# Patient Record
Sex: Male | Born: 1960 | Race: Black or African American | Hispanic: No | Marital: Married | Smoking: Never smoker
Health system: Southern US, Community
[De-identification: ages and names within clinical notes are randomized; demographics above are authoritative.]

## PROBLEM LIST (undated history)

## (undated) DIAGNOSIS — K4090 Unilateral inguinal hernia, without obstruction or gangrene, not specified as recurrent: Secondary | ICD-10-CM

## (undated) DIAGNOSIS — E785 Hyperlipidemia, unspecified: Secondary | ICD-10-CM

## (undated) DIAGNOSIS — E119 Type 2 diabetes mellitus without complications: Secondary | ICD-10-CM

## (undated) HISTORY — PX: WISDOM TOOTH EXTRACTION: SHX21

---

## 2017-05-18 ENCOUNTER — Ambulatory Visit: Payer: Self-pay | Admitting: General Surgery

## 2017-06-22 ENCOUNTER — Encounter (HOSPITAL_BASED_OUTPATIENT_CLINIC_OR_DEPARTMENT_OTHER)
Admission: RE | Admit: 2017-06-22 | Discharge: 2017-06-22 | Disposition: A | Discharge status: Home / Self Care | Payer: PRIVATE HEALTH INSURANCE | Source: Ambulatory Visit | Attending: General Surgery | Admitting: General Surgery

## 2017-06-22 ENCOUNTER — Other Ambulatory Visit: Payer: Self-pay

## 2017-06-22 ENCOUNTER — Encounter (HOSPITAL_BASED_OUTPATIENT_CLINIC_OR_DEPARTMENT_OTHER): Payer: Self-pay | Admitting: *Deleted

## 2017-06-22 DIAGNOSIS — E785 Hyperlipidemia, unspecified: Secondary | ICD-10-CM | POA: Diagnosis not present

## 2017-06-22 DIAGNOSIS — Z7982 Long term (current) use of aspirin: Secondary | ICD-10-CM | POA: Diagnosis not present

## 2017-06-22 DIAGNOSIS — E119 Type 2 diabetes mellitus without complications: Secondary | ICD-10-CM | POA: Diagnosis not present

## 2017-06-22 DIAGNOSIS — Z79899 Other long term (current) drug therapy: Secondary | ICD-10-CM | POA: Diagnosis not present

## 2017-06-22 DIAGNOSIS — Z7984 Long term (current) use of oral hypoglycemic drugs: Secondary | ICD-10-CM | POA: Diagnosis not present

## 2017-06-22 DIAGNOSIS — K4091 Unilateral inguinal hernia, without obstruction or gangrene, recurrent: Secondary | ICD-10-CM | POA: Diagnosis present

## 2017-06-22 DIAGNOSIS — K4090 Unilateral inguinal hernia, without obstruction or gangrene, not specified as recurrent: Secondary | ICD-10-CM | POA: Diagnosis not present

## 2017-06-22 LAB — BASIC METABOLIC PANEL
Anion gap: 8 (ref 5–15)
BUN: 30 mg/dL — ABNORMAL HIGH (ref 6–20)
CO2: 25 mmol/L (ref 22–32)
Calcium: 10.3 mg/dL (ref 8.9–10.3)
Chloride: 104 mmol/L (ref 101–111)
Creatinine, Ser: 1.43 mg/dL — ABNORMAL HIGH (ref 0.61–1.24)
GFR calc Af Amer: >60 mL/min (ref >60)
GFR calc non Af Amer: 55 mL/min — ABNORMAL LOW (ref >60)
Glucose, Bld: 217 mg/dL — ABNORMAL HIGH (ref 65–99)
Potassium: 4.6 mmol/L (ref 3.5–5.1)
Sodium: 137 mmol/L (ref 135–145)

## 2017-06-22 LAB — CBC WITH DIFFERENTIAL/PLATELET
Basophils Absolute: 0 10*3/uL (ref 0.0–0.1)
Basophils Relative: 1 %
Eosinophils Absolute: 0.1 10*3/uL (ref 0.0–0.7)
Eosinophils Relative: 3 %
HCT: 40.2 % (ref 39.0–52.0)
Hemoglobin: 13.4 g/dL (ref 13.0–17.0)
Lymphocytes Relative: 29 %
Lymphs Abs: 1.1 10*3/uL (ref 0.7–4.0)
MCH: 30.9 pg (ref 26.0–34.0)
MCHC: 33.3 g/dL (ref 30.0–36.0)
MCV: 92.6 fL (ref 78.0–100.0)
Monocytes Absolute: 0.4 10*3/uL (ref 0.1–1.0)
Monocytes Relative: 11 %
Neutro Abs: 2.2 10*3/uL (ref 1.7–7.7)
Neutrophils Relative %: 56 %
Platelets: 240 10*3/uL (ref 150–400)
RBC: 4.34 MIL/uL (ref 4.22–5.81)
RDW: 12.7 % (ref 11.5–15.5)
WBC: 3.8 10*3/uL — ABNORMAL LOW (ref 4.0–10.5)

## 2017-06-26 ENCOUNTER — Ambulatory Visit (HOSPITAL_BASED_OUTPATIENT_CLINIC_OR_DEPARTMENT_OTHER): Payer: PRIVATE HEALTH INSURANCE | Admitting: Anesthesiology

## 2017-06-26 ENCOUNTER — Encounter (HOSPITAL_BASED_OUTPATIENT_CLINIC_OR_DEPARTMENT_OTHER)
Admission: RE | Disposition: A | Discharge status: Home / Self Care | Payer: Self-pay | Source: Ambulatory Visit | Attending: General Surgery

## 2017-06-26 ENCOUNTER — Ambulatory Visit (HOSPITAL_BASED_OUTPATIENT_CLINIC_OR_DEPARTMENT_OTHER)
Admission: RE | Admit: 2017-06-26 | Discharge: 2017-06-26 | Disposition: A | Discharge status: Home / Self Care | Payer: PRIVATE HEALTH INSURANCE | Source: Ambulatory Visit | Attending: General Surgery | Admitting: General Surgery

## 2017-06-26 ENCOUNTER — Encounter (HOSPITAL_BASED_OUTPATIENT_CLINIC_OR_DEPARTMENT_OTHER): Payer: Self-pay

## 2017-06-26 DIAGNOSIS — Z7982 Long term (current) use of aspirin: Secondary | ICD-10-CM | POA: Insufficient documentation

## 2017-06-26 DIAGNOSIS — E119 Type 2 diabetes mellitus without complications: Secondary | ICD-10-CM | POA: Insufficient documentation

## 2017-06-26 DIAGNOSIS — Z79899 Other long term (current) drug therapy: Secondary | ICD-10-CM | POA: Insufficient documentation

## 2017-06-26 DIAGNOSIS — Z7984 Long term (current) use of oral hypoglycemic drugs: Secondary | ICD-10-CM | POA: Insufficient documentation

## 2017-06-26 DIAGNOSIS — K4090 Unilateral inguinal hernia, without obstruction or gangrene, not specified as recurrent: Secondary | ICD-10-CM | POA: Insufficient documentation

## 2017-06-26 DIAGNOSIS — Z01818 Encounter for other preprocedural examination: Secondary | ICD-10-CM

## 2017-06-26 DIAGNOSIS — E785 Hyperlipidemia, unspecified: Secondary | ICD-10-CM | POA: Insufficient documentation

## 2017-06-26 HISTORY — PX: INSERTION OF MESH: SHX5868

## 2017-06-26 HISTORY — DX: Hyperlipidemia, unspecified: E78.5

## 2017-06-26 HISTORY — PX: INGUINAL HERNIA REPAIR: SHX194

## 2017-06-26 HISTORY — DX: Type 2 diabetes mellitus without complications: E11.9

## 2017-06-26 HISTORY — DX: Unilateral inguinal hernia, without obstruction or gangrene, not specified as recurrent: K40.90

## 2017-06-26 LAB — GLUCOSE, CAPILLARY
Glucose-Capillary: 192 mg/dL — ABNORMAL HIGH (ref 65–99)
Glucose-Capillary: 179 mg/dL — ABNORMAL HIGH (ref 65–99)

## 2017-06-26 SURGERY — REPAIR, HERNIA, INGUINAL, ADULT
Anesthesia: Regional | Site: Groin | Laterality: Left

## 2017-06-26 MED ORDER — LIDOCAINE HCL (CARDIAC) 20 MG/ML IV SOLN
INTRAVENOUS | Status: DC | PRN
  Administered 2017-06-26: 30 mg via INTRAVENOUS

## 2017-06-26 MED ORDER — OXYCODONE HCL 5 MG PO TABS
5.0000 mg | ORAL_TABLET | Freq: Once | ORAL | Status: AC | PRN
  Administered 2017-06-26: 5 mg via ORAL

## 2017-06-26 MED ORDER — GABAPENTIN 300 MG PO CAPS
ORAL_CAPSULE | ORAL | Status: AC
  Filled 2017-06-26: qty 1

## 2017-06-26 MED ORDER — SUCCINYLCHOLINE CHLORIDE 200 MG/10ML IV SOSY
PREFILLED_SYRINGE | INTRAVENOUS | Status: AC
  Filled 2017-06-26: qty 10

## 2017-06-26 MED ORDER — ROPIVACAINE HCL 7.5 MG/ML IJ SOLN
INTRAMUSCULAR | Status: DC | PRN
  Administered 2017-06-26: 20 mL via PERINEURAL

## 2017-06-26 MED ORDER — HYDROMORPHONE HCL 1 MG/ML IJ SOLN: 0.2500 mg | INTRAMUSCULAR | Status: DC | PRN

## 2017-06-26 MED ORDER — SCOPOLAMINE 1 MG/3DAYS TD PT72: 1.0000 | MEDICATED_PATCH | Freq: Once | TRANSDERMAL | Status: DC | PRN

## 2017-06-26 MED ORDER — DEXAMETHASONE SODIUM PHOSPHATE 10 MG/ML IJ SOLN
INTRAMUSCULAR | Status: AC
  Filled 2017-06-26: qty 1

## 2017-06-26 MED ORDER — PROPOFOL 10 MG/ML IV BOLUS
INTRAVENOUS | Status: DC | PRN
  Administered 2017-06-26: 150 mg via INTRAVENOUS

## 2017-06-26 MED ORDER — PROPOFOL 10 MG/ML IV BOLUS
INTRAVENOUS | Status: AC
  Filled 2017-06-26: qty 20

## 2017-06-26 MED ORDER — FENTANYL CITRATE (PF) 100 MCG/2ML IJ SOLN
INTRAMUSCULAR | Status: DC | PRN
  Administered 2017-06-26: 100 ug via INTRAVENOUS
  Administered 2017-06-26: 25 ug via INTRAVENOUS

## 2017-06-26 MED ORDER — SODIUM CHLORIDE 0.9 % IV SOLN
INTRAVENOUS | Status: DC | PRN
  Administered 2017-06-26: 500 mL

## 2017-06-26 MED ORDER — CEFAZOLIN SODIUM 1 G IJ SOLR
INTRAMUSCULAR | Status: AC
  Filled 2017-06-26: qty 20

## 2017-06-26 MED ORDER — PHENYLEPHRINE 40 MCG/ML (10ML) SYRINGE FOR IV PUSH (FOR BLOOD PRESSURE SUPPORT)
PREFILLED_SYRINGE | INTRAVENOUS | Status: AC
  Filled 2017-06-26: qty 10

## 2017-06-26 MED ORDER — MIDAZOLAM HCL 2 MG/2ML IJ SOLN
INTRAMUSCULAR | Status: AC
  Filled 2017-06-26: qty 2

## 2017-06-26 MED ORDER — GABAPENTIN 300 MG PO CAPS
300.0000 mg | ORAL_CAPSULE | ORAL | Status: AC
  Administered 2017-06-26: 300 mg via ORAL

## 2017-06-26 MED ORDER — ONDANSETRON HCL 4 MG/2ML IJ SOLN
INTRAMUSCULAR | Status: DC | PRN
  Administered 2017-06-26: 4 mg via INTRAVENOUS

## 2017-06-26 MED ORDER — MIDAZOLAM HCL 5 MG/5ML IJ SOLN
INTRAMUSCULAR | Status: DC | PRN
  Administered 2017-06-26: 2 mg via INTRAVENOUS

## 2017-06-26 MED ORDER — BUPIVACAINE-EPINEPHRINE (PF) 0.25% -1:200000 IJ SOLN
INTRAMUSCULAR | Status: AC
  Filled 2017-06-26: qty 90

## 2017-06-26 MED ORDER — BUPIVACAINE HCL (PF) 0.5 % IJ SOLN
INTRAMUSCULAR | Status: DC | PRN
  Administered 2017-06-26: 9 mL

## 2017-06-26 MED ORDER — ACETAMINOPHEN 500 MG PO TABS
ORAL_TABLET | ORAL | Status: AC
  Filled 2017-06-26: qty 2

## 2017-06-26 MED ORDER — OXYCODONE HCL 5 MG/5ML PO SOLN: 5.0000 mg | Freq: Once | ORAL | Status: AC | PRN

## 2017-06-26 MED ORDER — BUPIVACAINE HCL (PF) 0.5 % IJ SOLN
INTRAMUSCULAR | Status: AC
  Filled 2017-06-26: qty 90

## 2017-06-26 MED ORDER — FENTANYL CITRATE (PF) 100 MCG/2ML IJ SOLN
INTRAMUSCULAR | Status: AC
  Filled 2017-06-26: qty 2

## 2017-06-26 MED ORDER — CEFAZOLIN SODIUM-DEXTROSE 2-4 GM/100ML-% IV SOLN
2.0000 g | INTRAVENOUS | Status: AC
  Administered 2017-06-26: 2 g via INTRAVENOUS

## 2017-06-26 MED ORDER — MIDAZOLAM HCL 2 MG/2ML IJ SOLN
1.0000 mg | INTRAMUSCULAR | Status: DC | PRN
  Administered 2017-06-26: 2 mg via INTRAVENOUS

## 2017-06-26 MED ORDER — SODIUM BICARBONATE 4 % IV SOLN
INTRAVENOUS | Status: AC
  Filled 2017-06-26: qty 5

## 2017-06-26 MED ORDER — LIDOCAINE 2% (20 MG/ML) 5 ML SYRINGE
INTRAMUSCULAR | Status: AC
  Filled 2017-06-26: qty 5

## 2017-06-26 MED ORDER — LIDOCAINE HCL (PF) 1 % IJ SOLN
INTRAMUSCULAR | Status: AC
  Filled 2017-06-26: qty 30

## 2017-06-26 MED ORDER — ONDANSETRON HCL 4 MG/2ML IJ SOLN
INTRAMUSCULAR | Status: AC
  Filled 2017-06-26: qty 2

## 2017-06-26 MED ORDER — DEXAMETHASONE SODIUM PHOSPHATE 4 MG/ML IJ SOLN
INTRAMUSCULAR | Status: DC | PRN
  Administered 2017-06-26: 10 mg via INTRAVENOUS

## 2017-06-26 MED ORDER — LACTATED RINGERS IV SOLN
INTRAVENOUS | Status: DC
  Administered 2017-06-26 (×2): via INTRAVENOUS

## 2017-06-26 MED ORDER — OXYCODONE HCL 5 MG PO TABS: 5.0000 mg | ORAL_TABLET | Freq: Four times a day (QID) | ORAL | 0 refills | Status: DC | PRN

## 2017-06-26 MED ORDER — PROMETHAZINE HCL 25 MG/ML IJ SOLN: 6.2500 mg | INTRAMUSCULAR | Status: DC | PRN

## 2017-06-26 MED ORDER — ACETAMINOPHEN 500 MG PO TABS
1000.0000 mg | ORAL_TABLET | ORAL | Status: AC
  Administered 2017-06-26: 1000 mg via ORAL

## 2017-06-26 MED ORDER — CHLORHEXIDINE GLUCONATE CLOTH 2 % EX PADS: 6.0000 | MEDICATED_PAD | Freq: Once | CUTANEOUS | Status: DC

## 2017-06-26 MED ORDER — PHENYLEPHRINE HCL 10 MG/ML IJ SOLN
INTRAMUSCULAR | Status: DC | PRN
  Administered 2017-06-26 (×5): 80 ug via INTRAVENOUS

## 2017-06-26 MED ORDER — OXYCODONE HCL 5 MG PO TABS
ORAL_TABLET | ORAL | Status: AC
  Filled 2017-06-26: qty 1

## 2017-06-26 MED ORDER — BUPIVACAINE-EPINEPHRINE (PF) 0.5% -1:200000 IJ SOLN
INTRAMUSCULAR | Status: AC
  Filled 2017-06-26: qty 90

## 2017-06-26 MED ORDER — SODIUM CHLORIDE 0.9 % IJ SOLN
INTRAMUSCULAR | Status: AC
  Filled 2017-06-26: qty 10

## 2017-06-26 MED ORDER — FENTANYL CITRATE (PF) 100 MCG/2ML IJ SOLN: 50.0000 ug | INTRAMUSCULAR | Status: DC | PRN

## 2017-06-26 SURGICAL SUPPLY — 48 items
BAG DECANTER FOR FLEXI CONT (MISCELLANEOUS) ×3 IMPLANT
BLADE CLIPPER SURG (BLADE) ×3 IMPLANT
BLADE SURG 10 STRL SS (BLADE) ×3 IMPLANT
BLADE SURG 15 STRL LF DISP TIS (BLADE) ×2 IMPLANT
BLADE SURG 15 STRL SS (BLADE) ×4
CHLORAPREP W/TINT 26ML (MISCELLANEOUS) ×3 IMPLANT
CLEANER CAUTERY TIP 5X5 PAD (MISCELLANEOUS) ×1 IMPLANT
CLOSURE WOUND 1/2 X4 (GAUZE/BANDAGES/DRESSINGS) ×1
COVER BACK TABLE 60X90IN (DRAPES) ×3 IMPLANT
COVER MAYO STAND STRL (DRAPES) ×3 IMPLANT
DERMABOND ADVANCED (GAUZE/BANDAGES/DRESSINGS) ×2
DERMABOND ADVANCED .7 DNX12 (GAUZE/BANDAGES/DRESSINGS) ×1 IMPLANT
DRAIN PENROSE 1/2X12 LTX STRL (WOUND CARE) ×3 IMPLANT
DRAPE LAPAROTOMY TRNSV 102X78 (DRAPE) ×3 IMPLANT
DRAPE UTILITY XL STRL (DRAPES) ×3 IMPLANT
DRSG TEGADERM 4X4.75 (GAUZE/BANDAGES/DRESSINGS) ×3 IMPLANT
ELECT REM PT RETURN 9FT ADLT (ELECTROSURGICAL) ×3
ELECTRODE REM PT RTRN 9FT ADLT (ELECTROSURGICAL) ×1 IMPLANT
GAUZE SPONGE 4X4 12PLY STRL LF (GAUZE/BANDAGES/DRESSINGS) ×3 IMPLANT
GLOVE BIO SURGEON STRL SZ 6.5 (GLOVE) ×2 IMPLANT
GLOVE BIO SURGEONS STRL SZ 6.5 (GLOVE) ×1
GLOVE BIOGEL PI IND STRL 8 (GLOVE) ×1 IMPLANT
GLOVE BIOGEL PI INDICATOR 8 (GLOVE) ×2
GLOVE ECLIPSE 7.5 STRL STRAW (GLOVE) ×3 IMPLANT
GOWN STRL REUS W/ TWL LRG LVL3 (GOWN DISPOSABLE) ×1 IMPLANT
GOWN STRL REUS W/ TWL XL LVL3 (GOWN DISPOSABLE) ×1 IMPLANT
GOWN STRL REUS W/TWL LRG LVL3 (GOWN DISPOSABLE) ×2
GOWN STRL REUS W/TWL XL LVL3 (GOWN DISPOSABLE) ×2
MESH HERNIA 3X6 (Mesh General) ×3 IMPLANT
NEEDLE HYPO 25X1 1.5 SAFETY (NEEDLE) ×3 IMPLANT
PACK BASIN DAY SURGERY FS (CUSTOM PROCEDURE TRAY) ×3 IMPLANT
PAD CLEANER CAUTERY TIP 5X5 (MISCELLANEOUS) ×2
PENCIL BUTTON HOLSTER BLD 10FT (ELECTRODE) ×3 IMPLANT
SLEEVE SCD COMPRESS KNEE MED (MISCELLANEOUS) IMPLANT
SPONGE INTESTINAL PEANUT (DISPOSABLE) ×3 IMPLANT
SPONGE LAP 4X18 X RAY DECT (DISPOSABLE) ×3 IMPLANT
STRIP CLOSURE SKIN 1/2X4 (GAUZE/BANDAGES/DRESSINGS) ×2 IMPLANT
SUT ETHIBOND 0 MO6 C/R (SUTURE) ×3 IMPLANT
SUT MON AB 4-0 PC3 18 (SUTURE) ×3 IMPLANT
SUT PROLENE 0 CT 2 (SUTURE) ×6 IMPLANT
SUT VIC AB 3-0 SH 27 (SUTURE) ×4
SUT VIC AB 3-0 SH 27X BRD (SUTURE) ×2 IMPLANT
SUT VICRYL 4-0 PS2 18IN ABS (SUTURE) IMPLANT
SUT VICRYL AB 3 0 TIES (SUTURE) IMPLANT
SYR BULB 3OZ (MISCELLANEOUS) ×3 IMPLANT
SYR CONTROL 10ML LL (SYRINGE) ×3 IMPLANT
TOWEL OR 17X24 6PK STRL BLUE (TOWEL DISPOSABLE) ×3 IMPLANT
TOWEL OR NON WOVEN STRL DISP B (DISPOSABLE) ×3 IMPLANT

## 2017-06-26 NOTE — Progress Notes (Signed)
Assisted Dr. Ellender with left, ultrasound guided, transabdominal plane block. Side rails up, monitors on throughout procedure. See vital signs in flow sheet. Tolerated Procedure well. 

## 2017-06-26 NOTE — Anesthesia Procedure Notes (Signed)
Anesthesia Regional Block: TAP block   Pre-Anesthetic Checklist: ,, timeout performed, Correct Patient, Correct Site, Correct Laterality, Correct Procedure,, site marked, risks and benefits discussed, Surgical consent,  Pre-op evaluation,  At surgeon's request and post-op pain management  Laterality: Left  Prep: chloraprep       Needles:  Injection technique: Single-shot  Needle Type: Echogenic Stimulator Needle     Needle Length: 9cm  Needle Gauge: 21     Additional Needles:   Procedures: ultrasound guided,,,,,,,,  Narrative:  Start time: 06/26/2017 7:10 AM End time: 06/26/2017 7:20 AM Injection made incrementally with aspirations every 5 mL.  Performed by: Personally  Anesthesiologist: Karna ChristmasELLENDER, RYAN P  Additional Notes: Functioning IV was confirmed and monitors were applied.  A 90mm 21ga Arrow echogenic stimulator needle was used. Sterile prep, hand hygiene and sterile gloves were used.  Negative aspiration and negative test dose prior to incremental administration of local anesthetic. The patient tolerated the procedure well.

## 2017-06-26 NOTE — Anesthesia Procedure Notes (Signed)
Procedure Name: LMA Insertion Date/Time: 06/26/2017 7:41 AM Performed by: Genevieve NorlanderLINKA, Veva Grimley L Pre-anesthesia Checklist: Patient identified, Emergency Drugs available, Suction available, Patient being monitored and Timeout performed Patient Re-evaluated:Patient Re-evaluated prior to induction Oxygen Delivery Method: Circle system utilized Preoxygenation: Pre-oxygenation with 100% oxygen Induction Type: IV induction Ventilation: Mask ventilation without difficulty LMA: LMA inserted LMA Size: 5.0 Number of attempts: 1 Airway Equipment and Method: Bite block Placement Confirmation: positive ETCO2 Tube secured with: Tape Dental Injury: Teeth and Oropharynx as per pre-operative assessment

## 2017-06-26 NOTE — Discharge Instructions (Addendum)
Open Hernia Repair, Adult, Care After This sheet gives you information about how to care for yourself after your procedure. Your health care provider may also give you more specific instructions. If you have problems or questions, contact your health care provider. What can I expect after the procedure? After the procedure, it is common to have:  Mild discomfort.  Slight bruising.  Minor swelling.  Pain in the abdomen.  Follow these instructions at home: Incision care   Follow instructions from your health care provider about how to take care of your incision area. Make sure you: ? Wash your hands with soap and water before you change your bandage (dressing). If soap and water are not available, use hand sanitizer. ? Change your dressing as told by your health care provider. ? Leave stitches (sutures), skin glue, or adhesive strips in place. These skin closures may need to stay in place for 2 weeks or longer. If adhesive strip edges start to loosen and curl up, you may trim the loose edges. Do not remove adhesive strips completely unless your health care provider tells you to do that.  Check your incision area every day for signs of infection. Check for: ? More redness, swelling, or pain. ? More fluid or blood. ? Warmth. ? Pus or a bad smell. Activity  Do not drive or use heavy machinery while taking prescription pain medicine. Do not drive until your health care provider approves.  Until your health care provider approves: ? Do not lift anything that is heavier than 10 lb (4.5 kg). ? Do not play contact sports.  Return to your normal activities as told by your health care provider. Ask your health care provider what activities are safe. General instructions  To prevent or treat constipation while you are taking prescription pain medicine, your health care provider may recommend that you: ? Drink enough fluid to keep your urine clear or pale yellow. ? Take over-the-counter or  prescription medicines. ? Eat foods that are high in fiber, such as fresh fruits and vegetables, whole grains, and beans. ? Limit foods that are high in fat and processed sugars, such as fried and sweet foods.  Take over-the-counter and prescription medicines only as told by your health care provider.  Do not take tub baths or go swimming until your health care provider approves.  Keep all follow-up visits as told by your health care provider. This is important. Contact a health care provider if:  You develop a rash.  You have more redness, swelling, or pain around your incision.  You have more fluid or blood coming from your incision.  Your incision feels warm to the touch.  You have pus or a bad smell coming from your incision.  You have a fever or chills.  You have blood in your stool (feces).  You have not had a bowel movement in 2-3 days.  Your pain is not controlled with medicine. Get help right away if:  You have chest pain or shortness of breath.  You feel light-headed or feel faint.  You have severe pain.  You vomit and your pain is worse. This information is not intended to replace advice given to you by your health care provider. Make sure you discuss any questions you have with your health care provider.  Marta Lamas. Gae Bon, MD, FACS [phone] Surgery    Post Anesthesia Home Care Instructions  Activity: Get plenty of rest for the remainder of the day. A responsible  individual must stay with you for 24 hours following the procedure.  For the next 24 hours, DO NOT: -Drive a car -Advertising copywriterperate machinery -Drink alcoholic beverages -Take any medication unless instructed by your physician -Make any legal decisions or sign important papers.  Meals: Start with liquid foods such as gelatin or soup. Progress to regular foods as tolerated. Avoid greasy, spicy, heavy foods. If nausea and/or vomiting occur, drink  only clear liquids until the nausea and/or vomiting subsides. Call your physician if vomiting continues.  Special Instructions/Symptoms: Your throat may feel dry or sore from the anesthesia or the breathing tube placed in your throat during surgery. If this causes discomfort, gargle with warm salt water. The discomfort should disappear within 24 hours.  If you had a scopolamine patch placed behind your ear for the management of post- operative nausea and/or vomiting:  1. The medication in the patch is effective for 72 hours, after which it should be removed.  Wrap patch in a tissue and discard in the trash. Wash hands thoroughly with soap and water. 2. You may remove the patch earlier than 72 hours if you experience unpleasant side effects which may include dry mouth, dizziness or visual disturbances. 3. Avoid touching the patch. Wash your hands with soap and water after contact with the patch.

## 2017-06-26 NOTE — Anesthesia Preprocedure Evaluation (Addendum)
Anesthesia Evaluation  Patient identified by MRN, date of birth, ID band Patient awake    Reviewed: Allergy & Precautions, NPO status , Patient's Chart, lab work & pertinent test results  Airway Mallampati: III  TM Distance: >3 FB Neck ROM: Full    Dental no notable dental hx.    Pulmonary neg pulmonary ROS,    Pulmonary exam normal breath sounds clear to auscultation       Cardiovascular negative cardio ROS Normal cardiovascular exam Rhythm:Regular Rate:Normal  ECG: NSR, rate 84   Neuro/Psych negative neurological ROS  negative psych ROS   GI/Hepatic negative GI ROS, Neg liver ROS,   Endo/Other  diabetes, Oral Hypoglycemic Agents  Renal/GU negative Renal ROS     Musculoskeletal negative musculoskeletal ROS (+)   Abdominal   Peds  Hematology negative hematology ROS (+)   Anesthesia Other Findings Hyperlipidemia  Reproductive/Obstetrics                            Anesthesia Physical Anesthesia Plan  ASA: III  Anesthesia Plan: General and Regional   Post-op Pain Management: GA combined w/ Regional for post-op pain   Induction: Intravenous  PONV Risk Score and Plan: 2 and Ondansetron, Dexamethasone and Midazolam  Airway Management Planned: LMA  Additional Equipment:   Intra-op Plan:   Post-operative Plan: Extubation in OR  Informed Consent: I have reviewed the patients History and Physical, chart, labs and discussed the procedure including the risks, benefits and alternatives for the proposed anesthesia with the patient or authorized representative who has indicated his/her understanding and acceptance.   Dental advisory given  Plan Discussed with: CRNA  Anesthesia Plan Comments:         Anesthesia Quick Evaluation

## 2017-06-26 NOTE — Op Note (Signed)
OPERATIVE REPORT  DATE OF OPERATION: 06/26/2017  PATIENT:  Coletta MemosKyle Klingberg  56 y.o. male  PRE-OPERATIVE DIAGNOSIS:  Symptomatic left inguinal hernia  POST-OPERATIVE DIAGNOSIS:  Symptomatic left inguinal hernia, indirect  INDICATION(S) FOR OPERATION:  Symptomatic left inguinal hernia  FINDINGS:  Indirect hernia with scarring and adhesions  PROCEDURE:  Procedure(s): OPEN LEFT INGUINAL HERNIA REPAIR WITH MESH INSERTION OF MESH  SURGEON:  Surgeon(s): Jimmye NormanWyatt, Edwin Baines, MD  ASSISTANT: None  ANESTHESIA:   general and LMA  COMPLICATIONS:  None  EBL: <10 ml  BLOOD ADMINISTERED: none  DRAINS: none   SPECIMEN:  No Specimen  COUNTS CORRECT:  YES  PROCEDURE DETAILS: The patient was taken to the operating room and placed on the table in supine position. After an adequate general laryngeal airway anesthetic was administered, he was prepped and draped in usual sterile manner exposing his left inguinal area.  He had received a tap block prior surgery. A proper timeout was performed identifying the patient and procedure to be performed. We marked the area of incision and a 6 cm incision was made curvilinear in the left inguinal area at the level of superficial inguinal ring. We dissected down to the external oblique fascia using electrocautery attaining adequate hemostasis as we went ahead. We opened the external oblique fascia through the superficial ring and bluntly dissected out the spermatic cord which contained what appeared to be a hernia sac. Once the spermatic cord was exposed we used a Penrose drain to place it up on work bench using a Penrose drain then bluntly dissected out the hernia sac in the anterior medial aspect of the cord. We dissected the sac down to the base and subsequently came across the base after opening the sac and determining the were no intra-abdominal contents contained within the sac. We twisted the second and ligated with suture ligatures of 0 Ethibond suture. 2-0 silk  sutures were used.  Once this was done there was some oozing in the wound from the cremasteric muscles which was controlled with electrocautery. We then placed an oval piece of polypropylene mesh in the inguinal floor attaching it to the reflected portion of the inguinal ligament and the conjoined tendon using running 0 Prolene suture. Once this was done we irrigated the floor with antibiotic solution. We closed the external oblique fascia on top the spermatic cord making sure not to entrap any nerves. This was done with 3-0 Vicryl. We then reapproximated Scarpa's fascia using interrupted 3-0 Vicryl sutures. We injected 0.5% Marcaine without epinephrine into the wound. We then closed the skin using a running subcuticular stitch of 4-0 Monocryl. Dermabond Steri-Strips and Tegaderms views complete the dressing.  All needle counts, sponge counts, and instrument counts were correct.  PATIENT DISPOSITION:  PACU - hemodynamically stable.   Bresha Hosack 9/11/20188:56 AM

## 2017-06-26 NOTE — H&P (Signed)
Coletta MemosKyle Klink is an 56 y.o. male.   Chief Complaint: Left inguinal hernia  HPI: Has noted this for about one year.  More symptomatic, especially with prolonged standing.  Easily reducible.  Past Medical History:  Diagnosis Date  . Diabetes mellitus without complication (HCC)   . Hyperlipidemia    on crestor  . Inguinal hernia    left    Past Surgical History:  Procedure Laterality Date  . WISDOM TOOTH EXTRACTION      History reviewed. No pertinent family history. Social History:  reports that he has never smoked. He has never used smokeless tobacco. He reports that he does not drink alcohol or use drugs.  Allergies: No Known Allergies  Medications Prior to Admission  Medication Sig Dispense Refill  . aspirin EC 81 MG tablet Take 81 mg by mouth daily.    . rosuvastatin (CRESTOR) 10 MG tablet Take 10 mg by mouth daily.    . Saxagliptin-Metformin 2.02-999 MG TB24 Take by mouth.      No results found for this or any previous visit (from the past 48 hour(s)). No results found.  Review of Systems  All other systems reviewed and are negative.   Blood pressure 126/86, pulse 83, temperature 98.1 F (36.7 C), temperature source Oral, resp. rate 15, height 5\' 8"  (1.727 m), weight 65.3 kg (144 lb), SpO2 99 %. Physical Exam  Nursing note and vitals reviewed. Constitutional: He appears well-developed and well-nourished.  HENT:  Head: Normocephalic and atraumatic.  Eyes: Pupils are equal, round, and reactive to light. EOM are normal.  Neck: Normal range of motion. Neck supple.  Cardiovascular: Normal rate, regular rhythm and normal heart sounds.   Respiratory: Effort normal and breath sounds normal.  GI: Normal appearance and bowel sounds are normal. A hernia is present. Hernia confirmed positive in the left inguinal area.    Genitourinary: Penis normal.  Musculoskeletal: Normal range of motion.  Neurological: He is alert.  Skin: Skin is warm and dry.  Psychiatric: He has a  normal mood and affect. His behavior is normal. Judgment and thought content normal.     Assessment/Plan LIH, for open repair with mesh.  Jimmye NormanJAMES Belky Mundo, MD 06/26/2017, 6:53 AM   Carmela HurtKyle L Custard MD 05/15/2017 12:46 PM Location: [facility] Patient #: [id number] DOB: 05/23/1961 Married / Language: English / Race: Black or African American Male   The patient is a 56 year old male.   Physical Exam Fayrene Fearing(Dani Wallner O. Lindie SpruceWyatt MD; 05/15/2017 12:55 PM) General Mental Status-Alert. General Appearance-Cooperative and Well groomed. Note: Looks younger than stated age Orientation-Oriented X4. Build & Nutrition-Muscular and USAALean.  Abdomen Inspection Hernias - Inguinal hernia - Left - Reducible(Mostly reducible in the reclined position). Note: No palpable hernia on the right.    Assessment & Plan Fayrene Fearing(Neena Beecham O. Darcel Frane MD; 05/15/2017 12:58 PM) NON-RECURRENT INGUINAL HERNIA WITHOUT OBSTRUCTION OR GANGRENE, UNSPECIFIED LATERALITY (K40.90) Story: One year it has been noticed. Initially left scrotal pain. Impression: Reducible moderate sized LIH. Tender with standing for a while. Plan on open repair because of his very lean size and minimize mesh implanted.

## 2017-06-26 NOTE — Anesthesia Postprocedure Evaluation (Signed)
Anesthesia Post Note  Patient: Allen Arnold  Procedure(s) Performed: Procedure(s) (LRB): OPEN LEFT INGUINAL HERNIA REPAIR WITH MESH (Left) INSERTION OF MESH (Left)     Patient location during evaluation: PACU Anesthesia Type: Regional and General Level of consciousness: awake and alert Pain management: pain level controlled Vital Signs Assessment: post-procedure vital signs reviewed and stable Respiratory status: spontaneous breathing, nonlabored ventilation, respiratory function stable and patient connected to nasal cannula oxygen Cardiovascular status: blood pressure returned to baseline and stable Postop Assessment: no signs of nausea or vomiting Anesthetic complications: no    Last Vitals:  Vitals:   06/26/17 0930 06/26/17 1024  BP: 134/88 (!) 136/91  Pulse: 82 78  Resp: 16 18  Temp:  37.1 C  SpO2: 100% 100%    Last Pain:  Vitals:   06/26/17 1024  TempSrc: Oral  PainSc: 0-No pain                 Lorin Hauck P Jayen Bromwell

## 2017-06-26 NOTE — Transfer of Care (Signed)
Immediate Anesthesia Transfer of Care Note  Patient: Allen Arnold  Procedure(s) Performed: Procedure(s): OPEN LEFT INGUINAL HERNIA REPAIR WITH MESH (Left) INSERTION OF MESH (Left)  Patient Location: PACU  Anesthesia Type:GA combined with regional for post-op pain  Level of Consciousness: sedated  Airway & Oxygen Therapy: Patient Spontanous Breathing and Patient connected to face mask oxygen  Post-op Assessment: Report given to RN and Post -op Vital signs reviewed and stable  Post vital signs: Reviewed and stable  Last Vitals:  Vitals:   06/26/17 0725 06/26/17 0730  BP:    Pulse: 72 80  Resp: 14 19  Temp:    SpO2: 100% 100%    Last Pain:  Vitals:   06/26/17 0638  TempSrc: Oral  PainSc: 5       Patients Stated Pain Goal: 2 (06/26/17 16100638)  Complications: No apparent anesthesia complications

## 2017-06-27 ENCOUNTER — Encounter (HOSPITAL_BASED_OUTPATIENT_CLINIC_OR_DEPARTMENT_OTHER): Payer: Self-pay | Admitting: General Surgery

## 2018-06-18 DIAGNOSIS — E1165 Type 2 diabetes mellitus with hyperglycemia: Secondary | ICD-10-CM | POA: Diagnosis not present

## 2018-08-15 ENCOUNTER — Other Ambulatory Visit: Payer: Self-pay | Admitting: Internal Medicine

## 2018-08-19 ENCOUNTER — Other Ambulatory Visit: Payer: Self-pay | Admitting: Internal Medicine

## 2018-09-16 ENCOUNTER — Other Ambulatory Visit: Payer: Self-pay | Admitting: Internal Medicine

## 2018-10-24 ENCOUNTER — Encounter: Payer: Self-pay | Admitting: Internal Medicine

## 2018-10-24 ENCOUNTER — Ambulatory Visit: Payer: PRIVATE HEALTH INSURANCE | Admitting: Internal Medicine

## 2018-10-24 ENCOUNTER — Other Ambulatory Visit: Payer: Self-pay

## 2018-10-24 VITALS — BP 118/62 | HR 86 | Temp 98.2°F | Ht 68.8 in | Wt 152.8 lb

## 2018-10-24 DIAGNOSIS — E1165 Type 2 diabetes mellitus with hyperglycemia: Secondary | ICD-10-CM

## 2018-10-24 DIAGNOSIS — E785 Hyperlipidemia, unspecified: Secondary | ICD-10-CM

## 2018-10-24 DIAGNOSIS — E1122 Type 2 diabetes mellitus with diabetic chronic kidney disease: Secondary | ICD-10-CM | POA: Insufficient documentation

## 2018-10-24 MED ORDER — INSULIN DEGLUDEC 100 UNIT/ML ~~LOC~~ SOPN: 20.0000 [IU] | PEN_INJECTOR | Freq: Every day | SUBCUTANEOUS | 5 refills | Status: DC

## 2018-10-24 NOTE — Progress Notes (Signed)
Subjective:     Patient ID: Allen Arnold , male    DOB: 06-19-1961 , 58 y.o.   MRN: [id number]   Chief Complaint  Patient presents with  . Diabetes  . Hyperlipidemia    HPI  Diabetes  He presents for his follow-up diabetic visit. He has type 2 diabetes mellitus. His disease course has been stable. There are no hypoglycemic associated symptoms. Pertinent negatives for diabetes include no blurred vision and no chest pain. There are no hypoglycemic complications. Risk factors for coronary artery disease include diabetes mellitus, dyslipidemia and male sex.    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 GAD ab negative.    Past Medical History:  Diagnosis Date  . Diabetes mellitus without complication ([place])   . Hyperlipidemia    on crestor  . Inguinal hernia    left     Family History  Problem Relation Age of Onset  . Diabetes Father      Current Outpatient Medications:  .  aspirin 81 MG chewable tablet, Chew by mouth daily., Disp: , Rfl:  .  insulin degludec (TRESIBA FLEXTOUCH) 100 UNIT/ML SOPN FlexTouch Pen, Inject 0.2 mLs (20 Units total) into the skin daily. Titrate to max dose 60 units nightly, Disp: 2 pen, Rfl: 5 .  KOMBIGLYZE XR 2.02-999 MG TB24, TAKE 1 TABLET BY ORAL ROUTE 2 TIMES EVERY DAY, Disp: 60 tablet, Rfl: 5 .  oxyCODONE (OXY IR/ROXICODONE) 5 MG immediate release tablet, Take 1-2 tablets (5-10 mg total) by mouth every 6 (six) hours as needed for severe pain. (Patient not taking: Reported on 10/24/2018), Disp: 30 tablet, Rfl: 0 .  ramipril (ALTACE) 2.5 MG capsule, TAKE 1 CAPSULE BY MOUTH EVERY DAY IN THE MORNING, Disp: 30 capsule, Rfl: 2 .  rosuvastatin (CRESTOR) 10 MG tablet, TAKE ONE TABLET BY MOUTH ONE TIME DAILY, Disp: 30 tablet, Rfl: 5   No Known Allergies   Review of Systems  Constitutional: Negative.   Eyes: Negative for blurred vision.  Respiratory: Negative.   Cardiovascular: Negative.  Negative for chest pain.  Gastrointestinal: Negative.    Neurological: Negative.   Psychiatric/Behavioral: Negative.      Today's Vitals   10/24/18 1603  BP: 118/62  Pulse: 86  Temp: 98.2 F (36.8 C)  TempSrc: Oral  SpO2: 98%  Weight: 152 lb 12.8 oz (69.3 kg)  Height: 5' 8.8" (1.748 m)   Body mass index is 22.7 kg/m.   Objective:  Physical Exam Vitals signs and nursing note reviewed.  Constitutional:      Appearance: Normal appearance.  HENT:     Head: Normocephalic and atraumatic.  Cardiovascular:     Rate and Rhythm: Normal rate and regular rhythm.     Heart sounds: Normal heart sounds.  Pulmonary:     Effort: Pulmonary effort is normal.     Breath sounds: Normal breath sounds.  Skin:    General: Skin is warm.  Neurological:     General: No focal deficit present.     Mental Status: He is alert.         Assessment And Plan:     1. Uncontrolled type 2 diabetes mellitus with hyperglycemia ([place])  I will check labs as listed below. He will rto in 3 months for a lab visit and in 6 months for his next office visit. He is in agreement with his treatment plan. I will request his most recent eye exam from [facility]. He is up to date with his immunizations.   -  Hemoglobin A1c - Lipid Profile - CMP14+EGFR      2. Hyperlipidemia  I will check non-fasting lipid panel and lfts.   Maximino Greenland, MD

## 2018-10-25 LAB — CMP14+EGFR
ALT: 10 IU/L (ref 0–44)
AST: 14 IU/L (ref 0–40)
Albumin/Globulin Ratio: 1.4 (ref 1.2–2.2)
Albumin: 4.5 g/dL (ref 3.5–5.5)
Alkaline Phosphatase: 63 IU/L (ref 39–117)
BUN/Creatinine Ratio: 12 (ref 9–20)
BUN: 15 mg/dL (ref 6–24)
Bilirubin Total: 0.5 mg/dL (ref 0.0–1.2)
CO2: 23 mmol/L (ref 20–29)
Calcium: 10.5 mg/dL — ABNORMAL HIGH (ref 8.7–10.2)
Chloride: 104 mmol/L (ref 96–106)
Creatinine, Ser: 1.26 mg/dL (ref 0.76–1.27)
GFR calc Af Amer: 74 mL/min/{1.73_m2} (ref >59)
GFR calc non Af Amer: 64 mL/min/{1.73_m2} (ref >59)
Globulin, Total: 3.2 g/dL (ref 1.5–4.5)
Glucose: 132 mg/dL — ABNORMAL HIGH (ref 65–99)
Potassium: 5.1 mmol/L (ref 3.5–5.2)
Sodium: 141 mmol/L (ref 134–144)
Total Protein: 7.7 g/dL (ref 6.0–8.5)

## 2018-10-25 LAB — HEMOGLOBIN A1C
Est. average glucose Bld gHb Est-mCnc: 163 mg/dL
Hgb A1c MFr Bld: 7.3 % — ABNORMAL HIGH (ref 4.8–5.6)

## 2018-10-25 LAB — LIPID PANEL
Chol/HDL Ratio: 2.4 ratio (ref 0.0–5.0)
Cholesterol, Total: 175 mg/dL (ref 100–199)
HDL: 74 mg/dL (ref >39)
LDL Calculated: 90 mg/dL (ref 0–99)
Triglycerides: 53 mg/dL (ref 0–149)
VLDL Cholesterol Cal: 11 mg/dL (ref 5–40)

## 2018-10-28 NOTE — Progress Notes (Signed)
Pt contacted regarding lab results: a1c 7.3, LDL is 90, goal is less than 70. Calcium elevated, increase water intake. Renal and liver fxn are stable.

## 2018-12-14 ENCOUNTER — Other Ambulatory Visit: Payer: Self-pay | Admitting: Internal Medicine

## 2018-12-14 MED ORDER — PEN NEEDLES 32G X 4 MM MISC: 32.0000 g | Freq: Every day | 11 refills | Status: DC

## 2018-12-18 ENCOUNTER — Other Ambulatory Visit: Payer: Self-pay | Admitting: Internal Medicine

## 2019-01-17 ENCOUNTER — Other Ambulatory Visit: Payer: Self-pay | Admitting: Internal Medicine

## 2019-01-23 ENCOUNTER — Other Ambulatory Visit: Payer: PRIVATE HEALTH INSURANCE

## 2019-02-18 ENCOUNTER — Other Ambulatory Visit: Payer: Self-pay | Admitting: Internal Medicine

## 2019-02-20 ENCOUNTER — Encounter: Payer: PRIVATE HEALTH INSURANCE | Admitting: Internal Medicine

## 2019-04-16 ENCOUNTER — Other Ambulatory Visit: Payer: Self-pay | Admitting: Internal Medicine

## 2019-06-06 ENCOUNTER — Telehealth: Payer: Self-pay

## 2019-06-06 NOTE — Telephone Encounter (Signed)
PA APPROVED FOR CONTINUATION OF KOMBIGLYZE.

## 2019-06-17 ENCOUNTER — Telehealth: Payer: Self-pay

## 2019-06-17 ENCOUNTER — Other Ambulatory Visit: Payer: Self-pay | Admitting: Internal Medicine

## 2019-06-17 NOTE — Telephone Encounter (Signed)
Left messages at home and work for pt to call back to make an appt for refills

## 2019-07-06 ENCOUNTER — Other Ambulatory Visit: Payer: Self-pay | Admitting: Internal Medicine

## 2019-07-15 ENCOUNTER — Other Ambulatory Visit: Payer: Self-pay

## 2019-07-15 ENCOUNTER — Encounter: Payer: Self-pay | Admitting: Internal Medicine

## 2019-07-15 ENCOUNTER — Ambulatory Visit: Payer: PRIVATE HEALTH INSURANCE | Admitting: Internal Medicine

## 2019-07-15 VITALS — BP 102/64 | HR 90 | Temp 97.4°F | Ht 69.2 in | Wt 150.2 lb

## 2019-07-15 DIAGNOSIS — E1165 Type 2 diabetes mellitus with hyperglycemia: Secondary | ICD-10-CM | POA: Diagnosis not present

## 2019-07-15 DIAGNOSIS — N289 Disorder of kidney and ureter, unspecified: Secondary | ICD-10-CM | POA: Diagnosis not present

## 2019-07-15 DIAGNOSIS — Z712 Person consulting for explanation of examination or test findings: Secondary | ICD-10-CM | POA: Diagnosis not present

## 2019-07-15 NOTE — Patient Instructions (Signed)

## 2019-07-15 NOTE — Progress Notes (Addendum)
Subjective:     Patient ID: Allen Arnold , male    DOB: [birth year] , 58 y.o.   MRN: [id number]   Chief Complaint  Patient presents with  . Diabetes    HPI  Diabetes He presents for his follow-up diabetic visit. He has type 2 diabetes mellitus. There are no hypoglycemic associated symptoms. Pertinent negatives for diabetes include no blurred vision and no chest pain. There are no hypoglycemic complications. Risk factors for coronary artery disease include diabetes mellitus, dyslipidemia and male sex. He is following a diabetic diet. He participates in exercise three times a week. An ACE inhibitor/angiotensin II receptor blocker is being taken. Eye exam is current.     Past Medical History:  Diagnosis Date  . Diabetes mellitus without complication ([place])   . Hyperlipidemia    on crestor  . Inguinal hernia    left     Family History  Problem Relation Age of Onset  . Diabetes Father   . Heart disease Mother      Current Outpatient Medications:  .  aspirin 81 MG chewable tablet, Chew by mouth daily., Disp: , Rfl:  .  Continuous Blood Gluc Sensor (FREESTYLE LIBRE 14 DAY SENSOR) MISC, USE AS DIRECTED TO CHECK BLOOD SUGARS 4 TIME PER DAY DX: E11.65, Disp: 100 each, Rfl: 11 .  Insulin Pen Needle (PEN NEEDLES) 32G X 4 MM MISC, 32 g by Does not apply route daily. Please dispense 32gx49m pen needles for use with insulin as directed, Disp: 100 each, Rfl: 11 .  KOMBIGLYZE XR 2.02-999 MG TB24, TAKE 1 TABLET BY ORAL ROUTE 2 TIMES EVERY DAY, Disp: 60 tablet, Rfl: 5 .  ramipril (ALTACE) 2.5 MG capsule, TAKE 1 CAPSULE BY MOUTH EVERY DAY IN THE MORNING, Disp: 90 capsule, Rfl: 0 .  rosuvastatin (CRESTOR) 10 MG tablet, TAKE ONE TABLET BY MOUTH ONE TIME DAILY, Disp: 90 tablet, Rfl: 1 .  TRESIBA FLEXTOUCH 100 UNIT/ML SOPN FlexTouch Pen, INJECT 20 UNITS TOTAL INTO THE SKIN DAILY. TITRATE TO MAX DOSE 60 UNITS NIGHTLY (Patient not taking: Reported on 07/15/2019), Disp: 3 pen, Rfl: 2   No Known Allergies    Review of Systems  Constitutional: Negative.   Eyes: Negative for blurred vision.  Respiratory: Negative.   Cardiovascular: Negative.  Negative for chest pain.  Gastrointestinal: Negative.   Neurological: Negative.   Psychiatric/Behavioral: Negative.      Today's Vitals   07/15/19 1602  BP: 102/64  Pulse: 90  Temp: (!) 97.4 F (36.3 C)  TempSrc: Oral  SpO2: 98%  Weight: 150 lb 3.2 oz (68.1 kg)  Height: 5' 9.2" (1.758 m)   Body mass index is 22.05 kg/m.   Objective:  Physical Exam Vitals signs and nursing note reviewed.  Constitutional:      Appearance: Normal appearance.  Cardiovascular:     Rate and Rhythm: Normal rate and regular rhythm.     Heart sounds: Normal heart sounds.  Pulmonary:     Effort: Pulmonary effort is normal.     Breath sounds: Normal breath sounds.  Skin:    General: Skin is warm.  Neurological:     General: No focal deficit present.     Mental Status: He is alert.  Psychiatric:        Mood and Affect: Mood normal.         Assessment And Plan:     1. Uncontrolled type 2 diabetes mellitus with hyperglycemia ([place]  Greater than 50% of face to face time was spent  in counseling and coordination of care.  We reviewed his Freestyle Libre readings- including timing of hypoglycemia events, hyperglycemic events and daily average readings. He will continue with current meds for now. I will check labs as listed below and make further recommendations once his labs are available for review. He declined flu vaccine. He agrees to receiving pneumonia vaccine at his next visit.  We spent 25 minutes discussing his disease state.  - Hemoglobin A1c - BMP8+EGFR  2. Renal insufficiency  REVIEWED RECENT LAB RESULTS WITH PATIENT. PATIENT HAS BEEN IDENTIFIED AS AT RISK FOR CKD. WILL CONTINUE TO MONITOR, AND WILL REFERRAL TO NEPHROLOGY IF PATIENT CONDITION CHANGES. PATIENT EDUCATED REGARDING NEPHROTOXIC MEDICATIONS, IMPORTANCE OF FLUID INTAKE.Marland Kitchen  Maximino Greenland, MD    THE PATIENT IS ENCOURAGED TO PRACTICE SOCIAL DISTANCING DUE TO THE COVID-19 PANDEMIC.

## 2019-07-16 ENCOUNTER — Other Ambulatory Visit: Payer: Self-pay | Admitting: Internal Medicine

## 2019-07-17 ENCOUNTER — Encounter: Payer: Self-pay | Admitting: Internal Medicine

## 2019-08-16 ENCOUNTER — Other Ambulatory Visit: Payer: Self-pay | Admitting: Internal Medicine

## 2019-09-16 ENCOUNTER — Ambulatory Visit: Payer: Self-pay

## 2019-09-25 ENCOUNTER — Other Ambulatory Visit: Payer: Self-pay | Admitting: Internal Medicine

## 2019-12-30 ENCOUNTER — Other Ambulatory Visit: Payer: Self-pay | Admitting: Internal Medicine

## 2020-03-14 ENCOUNTER — Other Ambulatory Visit: Payer: Self-pay | Admitting: Internal Medicine

## 2020-03-23 ENCOUNTER — Other Ambulatory Visit: Payer: Self-pay | Admitting: Internal Medicine

## 2020-04-21 ENCOUNTER — Other Ambulatory Visit: Payer: Self-pay | Admitting: Internal Medicine

## 2020-05-25 ENCOUNTER — Other Ambulatory Visit: Payer: Self-pay

## 2020-05-25 NOTE — Telephone Encounter (Signed)
Kombiglyze approved from 04/25/2020 - 05/25/2021.

## 2020-06-08 ENCOUNTER — Encounter: Payer: Self-pay | Admitting: Internal Medicine

## 2020-06-08 ENCOUNTER — Other Ambulatory Visit: Payer: Self-pay

## 2020-06-08 ENCOUNTER — Ambulatory Visit (INDEPENDENT_AMBULATORY_CARE_PROVIDER_SITE_OTHER): Payer: PRIVATE HEALTH INSURANCE | Admitting: Internal Medicine

## 2020-06-08 VITALS — BP 112/74 | HR 88 | Temp 97.8°F | Ht 69.2 in | Wt 155.4 lb

## 2020-06-08 DIAGNOSIS — N63 Unspecified lump in unspecified breast: Secondary | ICD-10-CM

## 2020-06-08 DIAGNOSIS — Z23 Encounter for immunization: Secondary | ICD-10-CM | POA: Diagnosis not present

## 2020-06-08 DIAGNOSIS — E1165 Type 2 diabetes mellitus with hyperglycemia: Secondary | ICD-10-CM

## 2020-06-08 DIAGNOSIS — N289 Disorder of kidney and ureter, unspecified: Secondary | ICD-10-CM | POA: Diagnosis not present

## 2020-06-08 LAB — POCT URINALYSIS DIPSTICK
Bilirubin, UA: NEGATIVE
Blood, UA: NEGATIVE
Glucose, UA: POSITIVE — AB
Ketones, UA: NEGATIVE
Leukocytes, UA: NEGATIVE
Nitrite, UA: NEGATIVE
Protein, UA: POSITIVE — AB
Spec Grav, UA: 1.02 (ref 1.010–1.025)
Urobilinogen, UA: 0.2 E.U./dL
pH, UA: 6.5 (ref 5.0–8.0)

## 2020-06-08 LAB — LIPID PANEL
Chol/HDL Ratio: 2.5 ratio (ref 0.0–5.0)
Cholesterol, Total: 154 mg/dL (ref 100–199)
HDL: 61 mg/dL (ref >39)
LDL Chol Calc (NIH): 82 mg/dL (ref 0–99)
Triglycerides: 49 mg/dL (ref 0–149)
VLDL Cholesterol Cal: 11 mg/dL (ref 5–40)

## 2020-06-08 LAB — CMP14+EGFR
ALT: 11 IU/L (ref 0–44)
AST: 13 IU/L (ref 0–40)
Albumin/Globulin Ratio: 1.7 (ref 1.2–2.2)
Albumin: 4.5 g/dL (ref 3.8–4.9)
Alkaline Phosphatase: 65 IU/L (ref 48–121)
BUN/Creatinine Ratio: 12 (ref 9–20)
BUN: 15 mg/dL (ref 6–24)
Bilirubin Total: 0.4 mg/dL (ref 0.0–1.2)
CO2: 25 mmol/L (ref 20–29)
Calcium: 10.1 mg/dL (ref 8.7–10.2)
Chloride: 104 mmol/L (ref 96–106)
Creatinine, Ser: 1.29 mg/dL — ABNORMAL HIGH (ref 0.76–1.27)
GFR calc Af Amer: 71 mL/min/{1.73_m2} (ref >59)
GFR calc non Af Amer: 62 mL/min/{1.73_m2} (ref >59)
Globulin, Total: 2.6 g/dL (ref 1.5–4.5)
Glucose: 167 mg/dL — ABNORMAL HIGH (ref 65–99)
Potassium: 4.8 mmol/L (ref 3.5–5.2)
Sodium: 140 mmol/L (ref 134–144)
Total Protein: 7.1 g/dL (ref 6.0–8.5)

## 2020-06-08 LAB — CBC
Hematocrit: 39.7 % (ref 37.5–51.0)
Hemoglobin: 13 g/dL (ref 13.0–17.7)
MCH: 30.9 pg (ref 26.6–33.0)
MCHC: 32.7 g/dL (ref 31.5–35.7)
MCV: 94 fL (ref 79–97)
Platelets: 208 10*3/uL (ref 150–450)
RBC: 4.21 x10E6/uL (ref 4.14–5.80)
RDW: 12.2 % (ref 11.6–15.4)
WBC: 3.3 10*3/uL — ABNORMAL LOW (ref 3.4–10.8)

## 2020-06-08 LAB — POCT UA - MICROALBUMIN
Albumin/Creatinine Ratio, Urine, POC: 300
Creatinine, POC: 200 mg/dL
Microalbumin Ur, POC: 80 mg/L

## 2020-06-08 LAB — HEMOGLOBIN A1C
Est. average glucose Bld gHb Est-mCnc: 194 mg/dL
Hgb A1c MFr Bld: 8.4 % — ABNORMAL HIGH (ref 4.8–5.6)

## 2020-06-08 MED ORDER — RAMIPRIL 2.5 MG PO CAPS
ORAL_CAPSULE | ORAL | 2 refills | Status: DC
Start: 2020-06-08 — End: 2021-05-09

## 2020-06-08 MED ORDER — KOMBIGLYZE XR 2.5-1000 MG PO TB24
1.0000 | ORAL_TABLET | Freq: Two times a day (BID) | ORAL | 2 refills | Status: DC
Start: 2020-06-08 — End: 2021-04-06

## 2020-06-08 MED ORDER — ROSUVASTATIN CALCIUM 10 MG PO TABS
10.0000 mg | ORAL_TABLET | Freq: Every day | ORAL | 2 refills | Status: DC
Start: 2020-06-08 — End: 2021-04-11

## 2020-06-08 MED ORDER — TRESIBA FLEXTOUCH 100 UNIT/ML ~~LOC~~ SOPN: PEN_INJECTOR | SUBCUTANEOUS | 3 refills | Status: AC

## 2020-06-08 MED ORDER — BD PEN NEEDLE NANO 2ND GEN 32G X 4 MM MISC: 2 refills | Status: DC

## 2020-06-08 NOTE — Progress Notes (Signed)
I,[name],acting as a Education administrator for Maximino Greenland, MD.,have documented all relevant documentation on the behalf of Maximino Greenland, MD,as directed by  Maximino Greenland, MD while in the presence of Maximino Greenland, MD.  This visit occurred during the SARS-CoV-2 public health emergency.  Safety protocols were in place, including screening questions prior to the visit, additional usage of staff PPE, and extensive cleaning of exam room while observing appropriate contact time as indicated for disinfecting solutions.  Subjective:     Patient ID: Allen Arnold , male    DOB: 03-15-1961 , 59 y.o.   MRN: [id number]   Chief Complaint  Patient presents with   Diabetes    HPI  The patient is here for a diabetes follow-up.  He reports compliance with meds. He uses Freestyle Libre to monitor his sugars.   Diabetes He presents for his follow-up diabetic visit. He has type 2 diabetes mellitus. There are no hypoglycemic associated symptoms. Pertinent negatives for diabetes include no blurred vision and no chest pain. There are no hypoglycemic complications. Risk factors for coronary artery disease include diabetes mellitus, dyslipidemia and male sex. He is following a diabetic diet. He participates in exercise three times a week. An ACE inhibitor/angiotensin II receptor blocker is being taken. Eye exam is current.     Past Medical History:  Diagnosis Date   Diabetes mellitus without complication ([place])    Hyperlipidemia    on crestor   Inguinal hernia    left     Family History  Problem Relation Age of Onset   Diabetes Father    Heart disease Mother      Current Outpatient Medications:    aspirin 81 MG chewable tablet, Chew by mouth daily., Disp: , Rfl:    Continuous Blood Gluc Sensor (FREESTYLE LIBRE 14 DAY SENSOR) MISC, USE AS DIRECTED TO CHECK BLOOD SUGARS 4 TIME PER DAY DX: E11.65, Disp: 100 each, Rfl: 11   insulin degludec (TRESIBA FLEXTOUCH) 100 UNIT/ML FlexTouch Pen,  INJECT 20 UNITS TOTAL INTO THE SKIN DAILY. TITRATE TO MAX DOSE 60 UNITS NIGHTLY, Disp: 3 mL, Rfl: 3   ramipril (ALTACE) 2.5 MG capsule, TAKE 1 CAPSULE BY MOUTH EVERY DAY IN THE MORNING, Disp: 90 capsule, Rfl: 2   rosuvastatin (CRESTOR) 10 MG tablet, Take 1 tablet (10 mg total) by mouth daily., Disp: 90 tablet, Rfl: 2   Saxagliptin-Metformin (KOMBIGLYZE XR) 2.02-999 MG TB24, Take 1 tablet by mouth 2 (two) times daily., Disp: 180 tablet, Rfl: 2   Insulin Pen Needle (BD PEN NEEDLE NANO 2ND GEN) 32G X 4 MM MISC, USE AS DIRECTED WITH INSULIN PEN, Disp: 100 each, Rfl: 2   No Known Allergies   Review of Systems  Constitutional: Negative.   Eyes: Negative for blurred vision.  Respiratory: Negative.   Cardiovascular: Negative.  Negative for chest pain.  Gastrointestinal: Negative.   Psychiatric/Behavioral: Negative.   All other systems reviewed and are negative.    Today's Vitals   06/08/20 0827  BP: 112/74  Pulse: 88  Temp: 97.8 F (36.6 C)  TempSrc: Oral  Weight: 155 lb 6.4 oz (70.5 kg)  Height: 5' 9.2" (1.758 m)   Body mass index is 22.82 kg/m.  Wt Readings from Last 3 Encounters:  06/08/20 155 lb 6.4 oz (70.5 kg)  07/15/19 150 lb 3.2 oz (68.1 kg)  10/24/18 152 lb 12.8 oz (69.3 kg)   Objective:  Physical Exam Vitals and nursing note reviewed.  Constitutional:      Appearance:  Normal appearance. He is obese.  HENT:     Head: Normocephalic and atraumatic.  Cardiovascular:     Rate and Rhythm: Normal rate and regular rhythm.     Heart sounds: Normal heart sounds.  Pulmonary:     Breath sounds: Normal breath sounds.  Chest:     Breasts:        Right: Mass present.        Left: Normal.       Comments: Nodule palpated right breast. It is soft, mobile, nontender. No overlying erythema.  Skin:    General: Skin is warm.  Neurological:     General: No focal deficit present.     Mental Status: He is alert and oriented to person, place, and time.         Assessment  And Plan:     1. Uncontrolled type 2 diabetes mellitus with hyperglycemia ([facility]) Comments: Chronic, I wil check labs as listed below. Encouraged to schedule yearly eye exam. I will adjust meds as needed.  - Hemoglobin A1c - CMP14+EGFR - CBC no Diff - Lipid panel - POCT Urinalysis Dipstick (81002) - POCT UA - Microalbumin  2. Renal insufficiency Comments: I will check urinalysis. I will also refer him to [facility] for further evaluation.  3. Breast nodule Comments: [facility] and diagnostic mammo. He is in agreement with treatment plan.  - [place] Unlisted Procedure Breast; Future - MM Digital Diagnostic Unilat R; Future  4. Immunization due Comments: I planned to administer Pneumovax, but he left prior to administration. Pt will be contacted to schedule nurse visit.      Patient was given opportunity to ask questions. Patient verbalized understanding of the plan and was able to repeat key elements of the plan. All questions were answered to their satisfaction.  Maximino Greenland, MD   I, Maximino Greenland, MD, have reviewed all documentation for this visit. The documentation on 06/14/20 for the exam, diagnosis, procedures, and orders are all accurate and complete.  THE PATIENT IS ENCOURAGED TO PRACTICE SOCIAL DISTANCING DUE TO THE COVID-19 PANDEMIC.

## 2020-06-08 NOTE — Patient Instructions (Addendum)
Diabetes Mellitus and Foot Care Foot care is an important part of your health, especially when you have diabetes. Diabetes may cause you to have problems because of poor blood flow (circulation) to your feet and legs, which can cause your skin to:  Become thinner and drier.  Break more easily.  Heal more slowly.  Peel and crack. You may also have nerve damage (neuropathy) in your legs and feet, causing decreased feeling in them. This means that you may not notice minor injuries to your feet that could lead to more serious problems. Noticing and addressing any potential problems early is the best way to prevent future foot problems. How to care for your feet Foot hygiene  Wash your feet daily with warm water and mild soap. Do not use hot water. Then, pat your feet and the areas between your toes until they are completely dry. Do not soak your feet as this can dry your skin.  Trim your toenails straight across. Do not dig under them or around the cuticle. File the edges of your nails with an emery board or nail file.  Apply a moisturizing lotion or petroleum jelly to the skin on your feet and to dry, brittle toenails. Use lotion that does not contain alcohol and is unscented. Do not apply lotion between your toes. Shoes and socks  Wear clean socks or stockings every day. Make sure they are not too tight. Do not wear knee-high stockings since they may decrease blood flow to your legs.  Wear shoes that fit properly and have enough cushioning. Always look in your shoes before you put them on to be sure there are no objects inside.  To break in new shoes, wear them for just a few hours a day. This prevents injuries on your feet. Wounds, scrapes, corns, and calluses  Check your feet daily for blisters, cuts, bruises, sores, and redness. If you cannot see the bottom of your feet, use a mirror or ask someone for help.  Do not cut corns or calluses or try to remove them with medicine.  If you  find a minor scrape, cut, or break in the skin on your feet, keep it and the skin around it clean and dry. You may clean these areas with mild soap and water. Do not clean the area with peroxide, alcohol, or iodine.  If you have a wound, scrape, corn, or callus on your foot, look at it several times a day to make sure it is healing and not infected. Check for: ? Redness, swelling, or pain. ? Fluid or blood. ? Warmth. ? Pus or a bad smell. General instructions  Do not cross your legs. This may decrease blood flow to your feet.  Do not use heating pads or hot water bottles on your feet. They may burn your skin. If you have lost feeling in your feet or legs, you may not know this is happening until it is too late.  Protect your feet from hot and cold by wearing shoes, such as at the beach or on hot pavement.  Schedule a complete foot exam at least once a year (annually) or more often if you have foot problems. If you have foot problems, report any cuts, sores, or bruises to your health care provider immediately. Contact a health care provider if:  You have a medical condition that increases your risk of infection and you have any cuts, sores, or bruises on your feet.  You have an injury that is not   healing.  You have redness on your legs or feet.  You feel burning or tingling in your legs or feet.  You have pain or cramps in your legs and feet.  Your legs or feet are numb.  Your feet always feel cold.  You have pain around a toenail. Get help right away if:  You have a wound, scrape, corn, or callus on your foot and: ? You have pain, swelling, or redness that gets worse. ? You have fluid or blood coming from the wound, scrape, corn, or callus. ? Your wound, scrape, corn, or callus feels warm to the touch. ? You have pus or a bad smell coming from the wound, scrape, corn, or callus. ? You have a fever. ? You have a red line going up your leg. Summary  Check your feet every day  for cuts, sores, red spots, swelling, and blisters.  Moisturize feet and legs daily.  Wear shoes that fit properly and have enough cushioning.  If you have foot problems, report any cuts, sores, or bruises to your health care provider immediately.  Schedule a complete foot exam at least once a year (annually) or more often if you have foot problems. This information is not intended to replace advice given to you by your health care provider. Make sure you discuss any questions you have with your health care provider. Document Revised: 06/25/2019 Document Reviewed: 11/03/2016 Elsevier Patient Education  2020 [employer]  

## 2020-06-11 ENCOUNTER — Telehealth: Payer: Self-pay

## 2020-06-11 NOTE — Telephone Encounter (Signed)
-----   Message from Dorothyann Peng, MD sent at 06/10/2020  6:46 PM EDT ----- Your hba1c is 8.4, this is up from your last visit. I want to recheck this in 3 months. We can schedule a lab visit.   Your kidney function is stable; however, there is protein in your urine. Due to the proteinuria, I would like to consider Renal evaluation. Are you okay with this?

## 2020-06-11 NOTE — Telephone Encounter (Signed)
Left message for pt to return call for lab results.

## 2020-06-28 ENCOUNTER — Ambulatory Visit
Admission: RE | Admit: 2020-06-28 | Discharge: 2020-06-28 | Disposition: A | Discharge status: Home / Self Care | Payer: PRIVATE HEALTH INSURANCE | Source: Ambulatory Visit | Attending: Internal Medicine | Admitting: Internal Medicine

## 2020-06-28 ENCOUNTER — Other Ambulatory Visit: Payer: Self-pay | Admitting: Internal Medicine

## 2020-06-28 ENCOUNTER — Other Ambulatory Visit: Payer: Self-pay

## 2020-06-28 DIAGNOSIS — N6310 Unspecified lump in the right breast, unspecified quadrant: Secondary | ICD-10-CM

## 2020-06-28 DIAGNOSIS — N63 Unspecified lump in unspecified breast: Secondary | ICD-10-CM

## 2020-07-05 ENCOUNTER — Other Ambulatory Visit: Payer: Self-pay

## 2020-07-05 ENCOUNTER — Ambulatory Visit
Admission: RE | Admit: 2020-07-05 | Discharge: 2020-07-05 | Disposition: A | Discharge status: Home / Self Care | Payer: PRIVATE HEALTH INSURANCE | Source: Ambulatory Visit | Attending: Internal Medicine | Admitting: Internal Medicine

## 2020-07-05 DIAGNOSIS — N6310 Unspecified lump in the right breast, unspecified quadrant: Secondary | ICD-10-CM

## 2020-07-15 ENCOUNTER — Other Ambulatory Visit: Payer: Self-pay | Admitting: Internal Medicine

## 2020-07-15 ENCOUNTER — Encounter: Payer: PRIVATE HEALTH INSURANCE | Admitting: Internal Medicine

## 2020-07-29 ENCOUNTER — Encounter (HOSPITAL_BASED_OUTPATIENT_CLINIC_OR_DEPARTMENT_OTHER): Payer: Self-pay | Admitting: Surgery

## 2020-07-29 ENCOUNTER — Other Ambulatory Visit: Payer: Self-pay

## 2020-07-30 ENCOUNTER — Ambulatory Visit: Payer: Self-pay | Admitting: Surgery

## 2020-07-30 ENCOUNTER — Encounter (HOSPITAL_BASED_OUTPATIENT_CLINIC_OR_DEPARTMENT_OTHER)
Admission: RE | Admit: 2020-07-30 | Discharge: 2020-07-30 | Disposition: A | Discharge status: Home / Self Care | Payer: PRIVATE HEALTH INSURANCE | Source: Ambulatory Visit | Attending: Surgery | Admitting: Surgery

## 2020-07-30 ENCOUNTER — Other Ambulatory Visit (HOSPITAL_COMMUNITY): Payer: PRIVATE HEALTH INSURANCE

## 2020-07-30 DIAGNOSIS — E119 Type 2 diabetes mellitus without complications: Secondary | ICD-10-CM | POA: Insufficient documentation

## 2020-07-30 DIAGNOSIS — Z01810 Encounter for preprocedural cardiovascular examination: Secondary | ICD-10-CM | POA: Diagnosis present

## 2020-07-30 DIAGNOSIS — Z01812 Encounter for preprocedural laboratory examination: Secondary | ICD-10-CM | POA: Insufficient documentation

## 2020-07-30 LAB — BASIC METABOLIC PANEL
Anion gap: 9 (ref 5–15)
BUN: 21 mg/dL — ABNORMAL HIGH (ref 6–20)
CO2: 22 mmol/L (ref 22–32)
Calcium: 10.1 mg/dL (ref 8.9–10.3)
Chloride: 106 mmol/L (ref 98–111)
Creatinine, Ser: 1.54 mg/dL — ABNORMAL HIGH (ref 0.61–1.24)
GFR, Estimated: 50 mL/min — ABNORMAL LOW (ref >60)
Glucose, Bld: 124 mg/dL — ABNORMAL HIGH (ref 70–99)
Potassium: 5.9 mmol/L — ABNORMAL HIGH (ref 3.5–5.1)
Sodium: 137 mmol/L (ref 135–145)

## 2020-07-30 NOTE — Progress Notes (Signed)
Surgical soap given with instructions, pt verbalized understanding.  

## 2020-08-02 ENCOUNTER — Encounter (HOSPITAL_BASED_OUTPATIENT_CLINIC_OR_DEPARTMENT_OTHER)
Admission: RE | Admit: 2020-08-02 | Discharge: 2020-08-02 | Disposition: A | Discharge status: Home / Self Care | Payer: PRIVATE HEALTH INSURANCE | Source: Ambulatory Visit | Attending: Surgery | Admitting: Surgery

## 2020-08-02 ENCOUNTER — Other Ambulatory Visit (HOSPITAL_COMMUNITY)
Admission: RE | Admit: 2020-08-02 | Discharge: 2020-08-02 | Disposition: A | Discharge status: Home / Self Care | Payer: PRIVATE HEALTH INSURANCE | Source: Ambulatory Visit | Attending: Surgery | Admitting: Surgery

## 2020-08-02 DIAGNOSIS — Z20822 Contact with and (suspected) exposure to covid-19: Secondary | ICD-10-CM | POA: Insufficient documentation

## 2020-08-02 DIAGNOSIS — Z01812 Encounter for preprocedural laboratory examination: Secondary | ICD-10-CM | POA: Insufficient documentation

## 2020-08-02 LAB — BASIC METABOLIC PANEL
Anion gap: 8 (ref 5–15)
BUN: 18 mg/dL (ref 6–20)
CO2: 26 mmol/L (ref 22–32)
Calcium: 9.9 mg/dL (ref 8.9–10.3)
Chloride: 106 mmol/L (ref 98–111)
Creatinine, Ser: 1.35 mg/dL — ABNORMAL HIGH (ref 0.61–1.24)
GFR, Estimated: 58 mL/min — ABNORMAL LOW (ref >60)
Glucose, Bld: 164 mg/dL — ABNORMAL HIGH (ref 70–99)
Potassium: 5.2 mmol/L — ABNORMAL HIGH (ref 3.5–5.1)
Sodium: 140 mmol/L (ref 135–145)

## 2020-08-02 LAB — SARS CORONAVIRUS 2 (TAT 6-24 HRS): SARS Coronavirus 2: NEGATIVE

## 2020-08-03 ENCOUNTER — Other Ambulatory Visit: Payer: Self-pay

## 2020-08-03 ENCOUNTER — Ambulatory Visit (HOSPITAL_BASED_OUTPATIENT_CLINIC_OR_DEPARTMENT_OTHER): Payer: PRIVATE HEALTH INSURANCE | Admitting: Anesthesiology

## 2020-08-03 ENCOUNTER — Encounter (HOSPITAL_BASED_OUTPATIENT_CLINIC_OR_DEPARTMENT_OTHER)
Admission: RE | Disposition: A | Discharge status: Home / Self Care | Payer: Self-pay | Source: Home / Self Care | Attending: Surgery

## 2020-08-03 ENCOUNTER — Ambulatory Visit (HOSPITAL_BASED_OUTPATIENT_CLINIC_OR_DEPARTMENT_OTHER)
Admission: RE | Admit: 2020-08-03 | Discharge: 2020-08-03 | Disposition: A | Discharge status: Home / Self Care | Payer: PRIVATE HEALTH INSURANCE | Attending: Surgery | Admitting: Surgery

## 2020-08-03 ENCOUNTER — Encounter (HOSPITAL_BASED_OUTPATIENT_CLINIC_OR_DEPARTMENT_OTHER): Payer: Self-pay | Admitting: Surgery

## 2020-08-03 DIAGNOSIS — E785 Hyperlipidemia, unspecified: Secondary | ICD-10-CM | POA: Diagnosis not present

## 2020-08-03 DIAGNOSIS — Z833 Family history of diabetes mellitus: Secondary | ICD-10-CM | POA: Insufficient documentation

## 2020-08-03 DIAGNOSIS — Z7984 Long term (current) use of oral hypoglycemic drugs: Secondary | ICD-10-CM | POA: Insufficient documentation

## 2020-08-03 DIAGNOSIS — E118 Type 2 diabetes mellitus with unspecified complications: Secondary | ICD-10-CM | POA: Insufficient documentation

## 2020-08-03 DIAGNOSIS — N6310 Unspecified lump in the right breast, unspecified quadrant: Secondary | ICD-10-CM | POA: Diagnosis not present

## 2020-08-03 DIAGNOSIS — Z8249 Family history of ischemic heart disease and other diseases of the circulatory system: Secondary | ICD-10-CM | POA: Diagnosis not present

## 2020-08-03 HISTORY — PX: BREAST BIOPSY: SHX20

## 2020-08-03 LAB — GLUCOSE, CAPILLARY
Glucose-Capillary: 134 mg/dL — ABNORMAL HIGH (ref 70–99)
Glucose-Capillary: 133 mg/dL — ABNORMAL HIGH (ref 70–99)

## 2020-08-03 SURGERY — BREAST BIOPSY
Anesthesia: Monitor Anesthesia Care | Site: Breast | Laterality: Right

## 2020-08-03 MED ORDER — MIDAZOLAM HCL 5 MG/5ML IJ SOLN
INTRAMUSCULAR | Status: DC | PRN
  Administered 2020-08-03: 2 mg via INTRAVENOUS

## 2020-08-03 MED ORDER — PROPOFOL 500 MG/50ML IV EMUL
INTRAVENOUS | Status: AC
  Filled 2020-08-03: qty 50

## 2020-08-03 MED ORDER — CEFAZOLIN SODIUM-DEXTROSE 2-4 GM/100ML-% IV SOLN: 2.0000 g | INTRAVENOUS | Status: DC

## 2020-08-03 MED ORDER — CHLORHEXIDINE GLUCONATE CLOTH 2 % EX PADS: 6.0000 | MEDICATED_PAD | Freq: Once | CUTANEOUS | Status: DC

## 2020-08-03 MED ORDER — ONDANSETRON HCL 4 MG/2ML IJ SOLN
INTRAMUSCULAR | Status: DC | PRN
  Administered 2020-08-03: 4 mg via INTRAVENOUS

## 2020-08-03 MED ORDER — 0.9 % SODIUM CHLORIDE (POUR BTL) OPTIME
TOPICAL | Status: DC | PRN
  Administered 2020-08-03: 60 mL

## 2020-08-03 MED ORDER — LACTATED RINGERS IV SOLN: INTRAVENOUS | Status: DC

## 2020-08-03 MED ORDER — MIDAZOLAM HCL 2 MG/2ML IJ SOLN
INTRAMUSCULAR | Status: AC
  Filled 2020-08-03: qty 2

## 2020-08-03 MED ORDER — LIDOCAINE-EPINEPHRINE 1 %-1:100000 IJ SOLN
INTRAMUSCULAR | Status: AC
  Filled 2020-08-03: qty 1

## 2020-08-03 MED ORDER — PROMETHAZINE HCL 25 MG/ML IJ SOLN: 6.2500 mg | INTRAMUSCULAR | Status: DC | PRN

## 2020-08-03 MED ORDER — HYDROCODONE-ACETAMINOPHEN 7.5-325 MG PO TABS
1.0000 | ORAL_TABLET | Freq: Once | ORAL | Status: DC | PRN
  Filled 2020-08-03: qty 1

## 2020-08-03 MED ORDER — FENTANYL CITRATE (PF) 100 MCG/2ML IJ SOLN
INTRAMUSCULAR | Status: DC | PRN
Start: 2020-08-03 — End: 2020-08-03
  Administered 2020-08-03 (×2): 50 ug via INTRAVENOUS

## 2020-08-03 MED ORDER — OXYCODONE HCL 5 MG PO TABS
5.0000 mg | ORAL_TABLET | Freq: Four times a day (QID) | ORAL | 0 refills | Status: DC | PRN
Start: 2020-08-03 — End: 2020-12-30

## 2020-08-03 MED ORDER — FENTANYL CITRATE (PF) 100 MCG/2ML IJ SOLN: 25.0000 ug | INTRAMUSCULAR | Status: DC | PRN

## 2020-08-03 MED ORDER — AMISULPRIDE (ANTIEMETIC) 5 MG/2ML IV SOLN: 10.0000 mg | Freq: Once | INTRAVENOUS | Status: DC | PRN

## 2020-08-03 MED ORDER — LIDOCAINE-EPINEPHRINE 1 %-1:100000 IJ SOLN
INTRAMUSCULAR | Status: DC | PRN
  Administered 2020-08-03: 10 mL

## 2020-08-03 MED ORDER — LIDOCAINE HCL (PF) 1 % IJ SOLN
INTRAMUSCULAR | Status: AC
  Filled 2020-08-03: qty 30

## 2020-08-03 MED ORDER — ENOXAPARIN SODIUM 40 MG/0.4ML ~~LOC~~ SOLN
40.0000 mg | Freq: Once | SUBCUTANEOUS | Status: AC
  Administered 2020-08-03: 40 mg via SUBCUTANEOUS

## 2020-08-03 MED ORDER — BUPIVACAINE HCL (PF) 0.25 % IJ SOLN
INTRAMUSCULAR | Status: AC
  Filled 2020-08-03: qty 30

## 2020-08-03 MED ORDER — CEFAZOLIN SODIUM-DEXTROSE 2-4 GM/100ML-% IV SOLN
INTRAVENOUS | Status: AC
  Filled 2020-08-03: qty 100

## 2020-08-03 MED ORDER — IBUPROFEN 800 MG PO TABS: 800.0000 mg | ORAL_TABLET | Freq: Three times a day (TID) | ORAL | 0 refills | Status: DC | PRN

## 2020-08-03 MED ORDER — PROPOFOL 500 MG/50ML IV EMUL
INTRAVENOUS | Status: DC | PRN
  Administered 2020-08-03: 200 ug/kg/min via INTRAVENOUS

## 2020-08-03 MED ORDER — ENOXAPARIN SODIUM 40 MG/0.4ML ~~LOC~~ SOLN
SUBCUTANEOUS | Status: AC
  Filled 2020-08-03: qty 0.4

## 2020-08-03 MED ORDER — FENTANYL CITRATE (PF) 100 MCG/2ML IJ SOLN
INTRAMUSCULAR | Status: AC
  Filled 2020-08-03: qty 2

## 2020-08-03 SURGICAL SUPPLY — 64 items
ADH SKN CLS APL DERMABOND .7 (GAUZE/BANDAGES/DRESSINGS)
APL PRP STRL LF DISP 70% ISPRP (MISCELLANEOUS) ×1
APL SKNCLS STERI-STRIP NONHPOA (GAUZE/BANDAGES/DRESSINGS) ×1
BENZOIN TINCTURE PRP APPL 2/3 (GAUZE/BANDAGES/DRESSINGS) ×3 IMPLANT
BLADE CLIPPER SURG (BLADE) ×3 IMPLANT
BLADE SURG 10 STRL SS (BLADE) ×3 IMPLANT
BLADE SURG 15 STRL LF DISP TIS (BLADE) ×1 IMPLANT
BLADE SURG 15 STRL SS (BLADE) ×3
CANISTER SUCT 1200ML W/VALVE (MISCELLANEOUS) IMPLANT
CHLORAPREP W/TINT 26 (MISCELLANEOUS) ×3 IMPLANT
CLOSURE STERI-STRIP 1/2X4 (GAUZE/BANDAGES/DRESSINGS)
CLOSURE WOUND 1/4X4 (GAUZE/BANDAGES/DRESSINGS) ×1
CLSR STERI-STRIP ANTIMIC 1/2X4 (GAUZE/BANDAGES/DRESSINGS) IMPLANT
COVER BACK TABLE 60X90IN (DRAPES) ×3 IMPLANT
COVER MAYO STAND STRL (DRAPES) ×3 IMPLANT
COVER WAND RF STERILE (DRAPES) IMPLANT
DECANTER SPIKE VIAL GLASS SM (MISCELLANEOUS) IMPLANT
DERMABOND ADVANCED (GAUZE/BANDAGES/DRESSINGS)
DERMABOND ADVANCED .7 DNX12 (GAUZE/BANDAGES/DRESSINGS) IMPLANT
DRAPE LAPAROTOMY 100X72 PEDS (DRAPES) ×3 IMPLANT
DRAPE UTILITY XL STRL (DRAPES) ×3 IMPLANT
DRSG TEGADERM 4X4.75 (GAUZE/BANDAGES/DRESSINGS) IMPLANT
ELECT COATED BLADE 2.86 ST (ELECTRODE) ×3 IMPLANT
ELECT NEEDLE BLADE 2-5/6 (NEEDLE) ×3 IMPLANT
ELECT REM PT RETURN 9FT ADLT (ELECTROSURGICAL) ×3
ELECTRODE REM PT RTRN 9FT ADLT (ELECTROSURGICAL) ×1 IMPLANT
GAUZE PACKING IODOFORM 1/4X15 (PACKING) IMPLANT
GAUZE SPONGE 4X4 12PLY STRL LF (GAUZE/BANDAGES/DRESSINGS) IMPLANT
GLOVE BIO SURGEON STRL SZ 6.5 (GLOVE) ×2 IMPLANT
GLOVE BIO SURGEONS STRL SZ 6.5 (GLOVE) ×1
GLOVE BIOGEL PI IND STRL 6 (GLOVE) ×2 IMPLANT
GLOVE BIOGEL PI IND STRL 6.5 (GLOVE) ×1 IMPLANT
GLOVE BIOGEL PI IND STRL 7.0 (GLOVE) ×1 IMPLANT
GLOVE BIOGEL PI INDICATOR 6 (GLOVE) ×4
GLOVE BIOGEL PI INDICATOR 6.5 (GLOVE) ×2
GLOVE BIOGEL PI INDICATOR 7.0 (GLOVE) ×2
GLOVE ECLIPSE 6.5 STRL STRAW (GLOVE) ×3 IMPLANT
GOWN STRL REUS W/ TWL LRG LVL3 (GOWN DISPOSABLE) ×3 IMPLANT
GOWN STRL REUS W/TWL LRG LVL3 (GOWN DISPOSABLE) ×9
KIT MARKER MARGIN INK (KITS) IMPLANT
NEEDLE HYPO 25X1 1.5 SAFETY (NEEDLE) ×3 IMPLANT
NS IRRIG 1000ML POUR BTL (IV SOLUTION) IMPLANT
PACK BASIN DAY SURGERY FS (CUSTOM PROCEDURE TRAY) ×3 IMPLANT
PENCIL SMOKE EVACUATOR (MISCELLANEOUS) ×3 IMPLANT
SLEEVE SCD COMPRESS KNEE MED (MISCELLANEOUS) IMPLANT
SPONGE LAP 18X18 RF (DISPOSABLE) ×3 IMPLANT
STRIP CLOSURE SKIN 1/4X4 (GAUZE/BANDAGES/DRESSINGS) ×2 IMPLANT
SUT ETHILON 2 0 FS 18 (SUTURE) IMPLANT
SUT MNCRL AB 4-0 PS2 18 (SUTURE) ×3 IMPLANT
SUT SILK 2 0 SH (SUTURE) IMPLANT
SUT VIC AB 2-0 SH 27 (SUTURE)
SUT VIC AB 2-0 SH 27XBRD (SUTURE) IMPLANT
SUT VIC AB 3-0 SH 27 (SUTURE)
SUT VIC AB 3-0 SH 27X BRD (SUTURE) IMPLANT
SUT VICRYL 3-0 CR8 SH (SUTURE) ×3 IMPLANT
SWAB COLLECTION DEVICE MRSA (MISCELLANEOUS) IMPLANT
SWAB CULTURE ESWAB REG 1ML (MISCELLANEOUS) IMPLANT
SYR BULB IRRIG 60ML STRL (SYRINGE) IMPLANT
SYR CONTROL 10ML LL (SYRINGE) ×3 IMPLANT
TOWEL GREEN STERILE FF (TOWEL DISPOSABLE) ×3 IMPLANT
TUBE CONNECTING 20'X1/4 (TUBING)
TUBE CONNECTING 20X1/4 (TUBING) IMPLANT
UNDERPAD 30X36 HEAVY ABSORB (UNDERPADS AND DIAPERS) ×3 IMPLANT
YANKAUER SUCT BULB TIP NO VENT (SUCTIONS) IMPLANT

## 2020-08-03 NOTE — Op Note (Signed)
   Operative Note   Date: 08/03/2020  Procedure: right breast excisional biopsy  Pre-op diagnosis: right breast mass, inconclusive core needle biopsy Post-op diagnosis: same  Indication and clinical history: The patient is a 59 y.o. year old male with a right breast mass and inconclusive pathologic diagnosis on core needle biopsy   Surgeon: Jesusita Oka, MD  Anesthesiologist: Elgie Congo, MD Anesthesia: MAC  Findings:  . Specimen: right breast excisional biopsy . EBL: <5cc . Drains/Implants: none  Disposition: PACU - hemodynamically stable.  Description of procedure: The patient was positioned supine on the operating room table. MAC anesthetic induction was uneventful. After clipping the surrounding hair, the right chest was prepped and draped in the usual sterile fashion. Time-out was performed verifying correct patient, procedure, laterality, signature of informed consent, and administration of pre-operative antibiotics.   After infiltration of local anesthetic agent, a curvilinear infra-areolar incision was made and deepened until the mass was reached. The mass was circumferentially dissected out using a combination of blunt dissection and electrocautery. After removal, the specimen was sent to pathology. Meticulous hemostasis was confirmed. The cavity was irrigated and the dead space obliterated with 3-0 vicryl suture. The skin was re-approximated with 4-0 monocryl suture.   Sterile dressings were applied using steri-strips, telfa, and tegaderm. All sponge and instrument counts were correct at the conclusion of the procedure. The patient was awakened from anesthesia and transported to the PACU in good condition. There were no complications.    Jesusita Oka, MD General and [facility]

## 2020-08-03 NOTE — Anesthesia Preprocedure Evaluation (Signed)
Anesthesia Evaluation  Patient identified by MRN, date of birth, ID band Patient awake    Reviewed: Allergy & Precautions, NPO status , Patient's Chart, lab work & pertinent test results  Airway Mallampati: III  TM Distance: >3 FB Neck ROM: Full    Dental no notable dental hx.    Pulmonary neg pulmonary ROS,    Pulmonary exam normal breath sounds clear to auscultation       Cardiovascular Exercise Tolerance: Good negative cardio ROS Normal cardiovascular exam Rhythm:Regular Rate:Normal  Normal sinus rhythm Normal ECG; rate 80 Confirmed by Cherlynn Kaiser [phone]) on 07/30/2020 8:05:18 PM   Neuro/Psych negative neurological ROS  negative psych ROS   GI/Hepatic negative GI ROS, Neg liver ROS,   Endo/Other  diabetes, Oral Hypoglycemic Agents  Renal/GU negative Renal ROS  negative genitourinary   Musculoskeletal negative musculoskeletal ROS (+)   Abdominal   Peds negative pediatric ROS (+)  Hematology negative hematology ROS (+)   Anesthesia Other Findings Hyperlipidemia  Reproductive/Obstetrics negative OB ROS                             Anesthesia Physical  Anesthesia Plan  ASA: III  Anesthesia Plan: MAC   Post-op Pain Management:    Induction: Intravenous  PONV Risk Score and Plan: 2 and Ondansetron, Dexamethasone and Midazolam  Airway Management Planned: Natural Airway and Simple Face Mask  Additional Equipment:   Intra-op Plan:   Post-operative Plan:   Informed Consent: I have reviewed the patients History and Physical, chart, labs and discussed the procedure including the risks, benefits and alternatives for the proposed anesthesia with the patient or authorized representative who has indicated his/her understanding and acceptance.       Plan Discussed with: CRNA and Anesthesiologist  Anesthesia Plan Comments: (Propofol gtt + natural airway. GA/LMA as backup  plan. )        Anesthesia Quick Evaluation

## 2020-08-03 NOTE — Discharge Instructions (Addendum)
May shower beginning 08/04/2020. Remove clear dressing and gauze, but do not remove steri-strips. May allow warm soapy water to run over incision, then rinse and pat dry. Do not soak in any water (tubs, hot tubs, pools, lakes, oceans) for one week.   Call the office at [phone] for temperature greater than 101.58F, worsening pain, redness or warmth at the incision site.  Please call [phone] to make an appointment for 2 weeks after surgery for wound check.     Post Anesthesia Home Care Instructions  Activity: Get plenty of rest for the remainder of the day. A responsible individual must stay with you for 24 hours following the procedure.  For the next 24 hours, DO NOT: -Drive a car -Advertising copywriter -Drink alcoholic beverages -Take any medication unless instructed by your physician -Make any legal decisions or sign important papers.  Meals: Start with liquid foods such as gelatin or soup. Progress to regular foods as tolerated. Avoid greasy, spicy, heavy foods. If nausea and/or vomiting occur, drink only clear liquids until the nausea and/or vomiting subsides. Call your physician if vomiting continues.  Special Instructions/Symptoms: Your throat may feel dry or sore from the anesthesia or the breathing tube placed in your throat during surgery. If this causes discomfort, gargle with warm salt water. The discomfort should disappear within 24 hours.  If you had a scopolamine patch placed behind your ear for the management of post- operative nausea and/or vomiting:  1. The medication in the patch is effective for 72 hours, after which it should be removed.  Wrap patch in a tissue and discard in the trash. Wash hands thoroughly with soap and water. 2. You may remove the patch earlier than 72 hours if you experience unpleasant side effects which may include dry mouth, dizziness or visual disturbances. 3. Avoid touching the patch. Wash your hands with soap and water after contact with  the patch.

## 2020-08-03 NOTE — Anesthesia Postprocedure Evaluation (Signed)
Anesthesia Post Note  Patient: Allen Arnold, Dr.  Jule Ser) Performed: RIGHT BREAST EXCISIONAL BIOPSY (Right Breast)     Patient location during evaluation: PACU Anesthesia Type: MAC Level of consciousness: awake and alert Pain management: pain level controlled Vital Signs Assessment: post-procedure vital signs reviewed and stable Respiratory status: spontaneous breathing, nonlabored ventilation, respiratory function stable and patient connected to nasal cannula oxygen Cardiovascular status: stable and blood pressure returned to baseline Postop Assessment: no apparent nausea or vomiting Anesthetic complications: no   No complications documented.  Last Vitals:  Vitals:   08/03/20 1500 08/03/20 1518  BP: 108/75 (!) 143/88  Pulse: 71 72  Resp: 16 16  Temp:  (!) 36.4 C  SpO2: 100% 100%    Last Pain:  Vitals:   08/03/20 1518  TempSrc:   PainSc: 0-No pain                 Merlinda Frederick

## 2020-08-03 NOTE — H&P (Signed)
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   HPI: 17M with a two month history of a right breast mass. Has not enlarged, no nipple discharge, no nipple retraction, no axillary adenopathy. Had axillary ultrasound and R breast CNBx with inconclsuive findings. Plan for excisional biopsy for definitive pathology. Discussed with the patient the option of a mastectomy, which he declined.  Past Medical History:  Diagnosis Date  . Diabetes mellitus without complication (HCC)   . Hyperlipidemia    on crestor  . Inguinal hernia    left    Past Surgical History:  Procedure Laterality Date  . INGUINAL HERNIA REPAIR Left 06/26/2017   Procedure: OPEN LEFT INGUINAL HERNIA REPAIR WITH MESH;  Surgeon: Jimmye Norman, MD;  Location: [facility];  Service: General;  Laterality: Left;  . INSERTION OF MESH Left 06/26/2017   Procedure: INSERTION OF MESH;  Surgeon: Jimmye Norman, MD;  Location: [facility];  Service: General;  Laterality: Left;  . WISDOM TOOTH EXTRACTION      Family History  Problem Relation Age of Onset  . Diabetes Father   . Heart disease Mother     Social History:  reports that he has never smoked. He has never used smokeless tobacco. He reports that he does not drink alcohol and does not use drugs.  Allergies: No Known Allergies  Medications: I have reviewed the patient's current medications.  Results for orders placed or performed during the hospital encounter of 08/03/20 (from the past 48 hour(s))  Basic metabolic panel     Status: Abnormal   Collection Time: 08/02/20  1:00 PM  Result Value Ref Range   Sodium 140 135 - 145 mmol/L   Potassium 5.2 (H) 3.5 - 5.1 mmol/L   Chloride 106 98 - 111 mmol/L   CO2 26 22 - 32 mmol/L   Glucose, Bld 164 (H) 70 - 99 mg/dL    Comment: Glucose reference range applies only to samples taken after fasting for at least 8 hours.   BUN 18 6 - 20 mg/dL   Creatinine, Ser 9.32 (H) 0.61 - 1.24 mg/dL   Calcium 9.9 8.9 -  67.1 mg/dL   GFR, Estimated 58 (L) >60 mL/min   Anion gap 8 5 - 15    Comment: Performed at [facility], [address] [place], [place] [address]  Glucose, capillary     Status: Abnormal   Collection Time: 08/03/20 12:36 PM  Result Value Ref Range   Glucose-Capillary 134 (H) 70 - 99 mg/dL    Comment: Glucose reference range applies only to samples taken after fasting for at least 8 hours.    No results found.  ROS 10 point review of systems is negative except as listed above in HPI.   Physical Exam Blood pressure 125/74, pulse 79, temperature 97.9 F (36.6 C), temperature source Oral, resp. rate 18, height 5\' 9"  (1.753 m), weight 69.7 kg, SpO2 100 %. Constitutional: well-developed, well-nourished HEENT: pupils equal, round, reactive to light, 64mm b/l, moist conjunctiva, external inspection of ears and nose normal, hearing intact Oropharynx: normal oropharyngeal mucosa, normal dentition Neck: no thyromegaly, trachea midline, no midline cervical tenderness to palpation Chest: breath sounds equal bilaterally, normal respiratory effort, no midline or lateral chest wall tenderness to palpation/deformity, R breast mass ~1.5cm at 6 o'clock, no masses on L and no axillary adenopathy Abdomen: soft, NT, no bruising, no hepatosplenomegaly Back: no wounds, no thoracic/lumbar spine tenderness to palpation, no thoracic/lumbar spine  stepoffs Rectal: deferred Extremities: 2+ radial and pedal pulses bilaterally, motor and sensation intact to bilateral UE and LE, no peripheral edema MSK: normal gait/station, no clubbing/cyanosis of fingers/toes, normal ROM of all four extremities Skin: warm, dry, no rashes Psych: normal memory, normal mood/affect    Assessment/Plan: 59M with right breast mass, s/p inconclusive R CNBx. Plan for excisional biopsy today. Informed consent was obtained after detailed explanation of risks, including bleeding, infection, hematoma/seroma. All questions answered to  the patient's satisfaction.   Diamantina Monks, MD General and Trauma Surgery [facility]

## 2020-08-03 NOTE — Transfer of Care (Signed)
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  Last Pain:  Vitals:   08/03/20 1219  TempSrc: Oral  PainSc: 0-No pain         Complications: No complications documented.

## 2020-08-04 ENCOUNTER — Encounter (HOSPITAL_BASED_OUTPATIENT_CLINIC_OR_DEPARTMENT_OTHER): Payer: Self-pay | Admitting: Surgery

## 2020-08-09 LAB — SURGICAL PATHOLOGY

## 2020-11-17 ENCOUNTER — Telehealth: Payer: Self-pay

## 2020-11-17 NOTE — Telephone Encounter (Signed)
I left a message on the pt's home phone that the pt's appt for his physical has to be canceled and rescheduled  Because the provider isn't going to be in the office.

## 2020-11-18 ENCOUNTER — Encounter: Payer: PRIVATE HEALTH INSURANCE | Admitting: Internal Medicine

## 2020-12-30 ENCOUNTER — Other Ambulatory Visit: Payer: Self-pay

## 2020-12-30 ENCOUNTER — Ambulatory Visit: Payer: PRIVATE HEALTH INSURANCE | Admitting: Internal Medicine

## 2020-12-30 ENCOUNTER — Encounter: Payer: Self-pay | Admitting: Internal Medicine

## 2020-12-30 VITALS — BP 116/82 | HR 78 | Temp 98.0°F | Ht 69.0 in | Wt 160.0 lb

## 2020-12-30 DIAGNOSIS — N289 Disorder of kidney and ureter, unspecified: Secondary | ICD-10-CM

## 2020-12-30 DIAGNOSIS — Z23 Encounter for immunization: Secondary | ICD-10-CM

## 2020-12-30 DIAGNOSIS — Z0000 Encounter for general adult medical examination without abnormal findings: Secondary | ICD-10-CM

## 2020-12-30 DIAGNOSIS — E1165 Type 2 diabetes mellitus with hyperglycemia: Secondary | ICD-10-CM

## 2020-12-30 LAB — POCT URINALYSIS DIPSTICK
Bilirubin, UA: NEGATIVE
Blood, UA: NEGATIVE
Glucose, UA: NEGATIVE
Ketones, UA: NEGATIVE
Leukocytes, UA: NEGATIVE
Nitrite, UA: NEGATIVE
Protein, UA: POSITIVE — AB
Spec Grav, UA: 1.025 (ref 1.010–1.025)
Urobilinogen, UA: 0.2 E.U./dL
pH, UA: 5 (ref 5.0–8.0)

## 2020-12-30 LAB — POCT UA - MICROALBUMIN
Creatinine, POC: 300 mg/dL
Microalbumin Ur, POC: 80 mg/L

## 2020-12-30 MED ORDER — SHINGRIX 50 MCG/0.5ML IM SUSR: 0.5000 mL | Freq: Once | INTRAMUSCULAR | 0 refills | Status: AC

## 2020-12-30 NOTE — Progress Notes (Signed)
I,Allen Arnold,acting as a Education administrator for Allen Greenland, MD.,have documented all relevant documentation on the behalf of Allen Greenland, MD,as directed by  Allen Greenland, MD while in the presence of Allen Greenland, MD.  This visit occurred during the SARS-CoV-2 public health emergency.  Safety protocols were in place, including screening questions prior to the visit, additional usage of staff PPE, and extensive cleaning of exam room while observing appropriate contact time as indicated for disinfecting solutions.  Subjective:     Patient ID: Allen Arnold, Dr. , male    DOB: 04-25-1961 , 60 y.o.   MRN: [id number]   Chief Complaint  Patient presents with  . Annual Exam  . Diabetes    HPI  The patient is here for a physical examination. He reports compliance with meds. He denies having any new concerns. He denies headaches, chest pain and shortness of breath. He has yet to see nephrologist.   Diabetes He presents for his follow-up diabetic visit. He has type 2 diabetes mellitus. There are no hypoglycemic associated symptoms. Pertinent negatives for diabetes include no blurred vision and no chest pain. There are no hypoglycemic complications. Risk factors for coronary artery disease include diabetes mellitus, dyslipidemia and male sex. He is following a diabetic diet. He participates in exercise three times a week. An ACE inhibitor/angiotensin II receptor blocker is being taken. Eye exam is current.     Past Medical History:  Diagnosis Date  . Diabetes mellitus without complication ([place])   . Hyperlipidemia    on crestor  . Inguinal hernia    left     Family History  Problem Relation Age of Onset  . Diabetes Father   . Heart disease Mother      Current Outpatient Medications:  .  aspirin 81 MG chewable tablet, Chew by mouth daily., Disp: , Rfl:  .  Continuous Blood Gluc Sensor (FREESTYLE LIBRE 14 DAY SENSOR) MISC, USE AS DIRECTED TO CHECK BLOOD SUGARS 4 TIME PER DAY DX:  E11.65, Disp: 6 each, Rfl: 3 .  insulin degludec (TRESIBA FLEXTOUCH) 100 UNIT/ML FlexTouch Pen, INJECT 20 UNITS TOTAL INTO THE SKIN DAILY. TITRATE TO MAX DOSE 60 UNITS NIGHTLY (Patient taking differently: INJECT 5 UNITS TOTAL INTO THE SKIN DAILY. TITRATE TO MAX DOSE 60 UNITS NIGHTLY), Disp: 3 mL, Rfl: 3 .  Insulin Pen Needle (BD PEN NEEDLE NANO 2ND GEN) 32G X 4 MM MISC, USE AS DIRECTED WITH INSULIN PEN, Disp: 100 each, Rfl: 2 .  ramipril (ALTACE) 2.5 MG capsule, TAKE 1 CAPSULE BY MOUTH EVERY DAY IN THE MORNING, Disp: 90 capsule, Rfl: 2 .  rosuvastatin (CRESTOR) 10 MG tablet, Take 1 tablet (10 mg total) by mouth daily., Disp: 90 tablet, Rfl: 2 .  Saxagliptin-Metformin (KOMBIGLYZE XR) 2.02-999 MG TB24, Take 1 tablet by mouth 2 (two) times daily., Disp: 180 tablet, Rfl: 2   No Known Allergies   Men's preventive visit. Patient Health Questionnaire (PHQ-2) is  [facility] [facility] Visit from 12/30/2020 in [facility]  PHQ-2 Total Score 0    . Patient is on a healthy diet. Marital status: Married. Relevant history for alcohol use is:  Social History   Substance and Sexual Activity  Alcohol Use No  . Relevant history for tobacco use is:  Social History   Tobacco Use  Smoking Status Never Smoker  Smokeless Tobacco Never Used  .   Review of Systems  Constitutional: Negative.   HENT: Negative.   Eyes: Negative.  Negative  for blurred vision.  Respiratory: Negative.   Cardiovascular: Negative.  Negative for chest pain.  Gastrointestinal: Negative.   Endocrine: Negative.   Genitourinary: Negative.   Musculoskeletal: Negative.   Skin: Negative.   Allergic/Immunologic: Negative.   Neurological: Negative.   Hematological: Negative.   Psychiatric/Behavioral: Negative.      Today's Vitals   12/30/20 1233  BP: 116/82  Pulse: 78  Temp: 98 F (36.7 C)  TempSrc: Oral  Weight: 160 lb (72.6 kg)  Height: '5\' 9"'  (1.753 m)   Body mass index is 23.63 kg/m.  Wt Readings  from Last 3 Encounters:  12/30/20 160 lb (72.6 kg)  08/03/20 153 lb 10.6 oz (69.7 kg)  06/08/20 155 lb 6.4 oz (70.5 kg)   Objective:  Physical Exam Vitals and nursing note reviewed.  Constitutional:      Appearance: Normal appearance.  HENT:     Head: Normocephalic and atraumatic.     Right Ear: Tympanic membrane, ear canal and external ear normal.     Left Ear: Tympanic membrane, ear canal and external ear normal.     Nose:     Comments: Masked     Mouth/Throat:     Comments: Masked  Eyes:     Extraocular Movements: Extraocular movements intact.     Conjunctiva/sclera: Conjunctivae normal.     Pupils: Pupils are equal, round, and reactive to light.  Cardiovascular:     Rate and Rhythm: Normal rate and regular rhythm.     Pulses:          Dorsalis pedis pulses are 3+ on the right side and 3+ on the left side.     Heart sounds: Normal heart sounds.  Pulmonary:     Effort: Pulmonary effort is normal.     Breath sounds: Normal breath sounds.  Abdominal:     General: Abdomen is flat. Bowel sounds are normal.     Palpations: Abdomen is soft.  Genitourinary:    Comments: Deferred  Musculoskeletal:        General: Normal range of motion.     Cervical back: Normal range of motion.  Feet:     Right foot:     Protective Sensation: 5 sites tested. 5 sites sensed.     Skin integrity: Dry skin present.     Toenail Condition: Right toenails are normal.     Left foot:     Protective Sensation: 5 sites tested. 5 sites sensed.     Skin integrity: Dry skin present.     Toenail Condition: Left toenails are normal.  Skin:    General: Skin is warm.  Neurological:     General: No focal deficit present.     Mental Status: He is alert and oriented to person, place, and time.         Assessment And Plan:    1. Routine general medical examination at a health care facility Comments: A full exam was performed, DRE deferred. PATIENT IS ADVISED TO GET 30-45 MINUTES REGULAR EXERCISE NO  LESS THAN FOUR TO FIVE DAYS PER WEEK - BOTH WEIGHTBEARING EXERCISES AND AEROBIC ARE RECOMMENDED.  PATIENT IS ADVISED TO FOLLOW A HEALTHY DIET WITH AT LEAST SIX FRUITS/VEGGIES PER DAY, DECREASE INTAKE OF RED MEAT, AND TO INCREASE FISH INTAKE TO TWO DAYS PER WEEK.  MEATS/FISH SHOULD NOT BE FRIED, BAKED OR BROILED IS PREFERABLE.  IT IS ALSO IMPORTANT TO CUT BACK ON YOUR SUGAR INTAKE. PLEASE AVOID ANYTHING WITH ADDED SUGAR, CORN SYRUP OR OTHER SWEETENERS. IF  YOU MUST USE A SWEETENER, YOU CAN TRY STEVIA. IT IS ALSO IMPORTANT TO AVOID ARTIFICIALLY SWEETENERS AND DIET BEVERAGES. LASTLY, I SUGGEST WEARING SPF 50 SUNSCREEN ON EXPOSED PARTS AND ESPECIALLY WHEN IN THE DIRECT SUNLIGHT FOR AN EXTENDED PERIOD OF TIME.  PLEASE AVOID FAST FOOD RESTAURANTS AND INCREASE YOUR WATER INTAKE.  - CBC - Hemoglobin A1c - CMP14+EGFR - PSA - Lipid panel - TSH  2. Uncontrolled type 2 diabetes mellitus with hyperglycemia ([place]) Comments: Diabetic foot exam was performed. He agrees to return for MODY labwork. I will consult [facility] to confirm tests that need to be ordered. I DISCUSSED WITH THE PATIENT AT LENGTH REGARDING THE GOALS OF GLYCEMIC CONTROL AND POSSIBLE LONG-TERM COMPLICATIONS.  I  ALSO STRESSED THE IMPORTANCE OF COMPLIANCE WITH HOME GLUCOSE MONITORING, DIETARY RESTRICTIONS INCLUDING AVOIDANCE OF SUGARY DRINKS/PROCESSED FOODS,  ALONG WITH REGULAR EXERCISE.  I  ALSO STRESSED THE IMPORTANCE OF ANNUAL EYE EXAMS, SELF FOOT CARE AND COMPLIANCE WITH OFFICE VISITS.  - POCT Urinalysis Dipstick (81002) - POCT UA - Microalbumin - EKG 12-Lead - Ambulatory referral to Nephrology  3. Renal insufficiency Comments: It appears he has stage 3 CKD. I will refer him to Nephrology for further consultation. I also think Carrington Clamp would be appropriate in this setting. I do plan to start Farxiga in the near future.  - Ambulatory referral to Nephrology  4. Immunization due Comments: I will send Shingrix to his local pharmacy. He will rto  for pneumovax vaccine.    Patient was given opportunity to ask questions. Patient verbalized understanding of the plan and was able to repeat key elements of the plan. All questions were answered to their satisfaction.   I, Allen Greenland, MD, have reviewed all documentation for this visit. The documentation on 01/10/21 for the exam, diagnosis, procedures, and orders are all accurate and complete.  THE PATIENT IS ENCOURAGED TO PRACTICE SOCIAL DISTANCING DUE TO THE COVID-19 PANDEMIC.

## 2020-12-30 NOTE — Patient Instructions (Signed)

## 2020-12-31 LAB — LIPID PANEL
Chol/HDL Ratio: 2.4 ratio (ref 0.0–5.0)
Cholesterol, Total: 162 mg/dL (ref 100–199)
HDL: 68 mg/dL (ref >39)
LDL Chol Calc (NIH): 83 mg/dL (ref 0–99)
Triglycerides: 54 mg/dL (ref 0–149)
VLDL Cholesterol Cal: 11 mg/dL (ref 5–40)

## 2020-12-31 LAB — CMP14+EGFR
ALT: 14 IU/L (ref 0–44)
AST: 19 IU/L (ref 0–40)
Albumin/Globulin Ratio: 1.5 (ref 1.2–2.2)
Albumin: 4.7 g/dL (ref 3.8–4.9)
Alkaline Phosphatase: 74 IU/L (ref 44–121)
BUN/Creatinine Ratio: 18 (ref 9–20)
BUN: 28 mg/dL — ABNORMAL HIGH (ref 6–24)
Bilirubin Total: 0.4 mg/dL (ref 0.0–1.2)
CO2: 21 mmol/L (ref 20–29)
Calcium: 9.8 mg/dL (ref 8.7–10.2)
Chloride: 101 mmol/L (ref 96–106)
Creatinine, Ser: 1.59 mg/dL — ABNORMAL HIGH (ref 0.76–1.27)
Globulin, Total: 3.2 g/dL (ref 1.5–4.5)
Glucose: 197 mg/dL — ABNORMAL HIGH (ref 65–99)
Potassium: 5.4 mmol/L — ABNORMAL HIGH (ref 3.5–5.2)
Sodium: 137 mmol/L (ref 134–144)
Total Protein: 7.9 g/dL (ref 6.0–8.5)
eGFR: 50 mL/min/{1.73_m2} — ABNORMAL LOW (ref >59)

## 2020-12-31 LAB — CBC
Hematocrit: 41.9 % (ref 37.5–51.0)
Hemoglobin: 13.9 g/dL (ref 13.0–17.7)
MCH: 31.2 pg (ref 26.6–33.0)
MCHC: 33.2 g/dL (ref 31.5–35.7)
MCV: 94 fL (ref 79–97)
Platelets: 231 10*3/uL (ref 150–450)
RBC: 4.45 x10E6/uL (ref 4.14–5.80)
RDW: 12.7 % (ref 11.6–15.4)
WBC: 3 10*3/uL — ABNORMAL LOW (ref 3.4–10.8)

## 2020-12-31 LAB — HEMOGLOBIN A1C
Est. average glucose Bld gHb Est-mCnc: 183 mg/dL
Hgb A1c MFr Bld: 8 % — ABNORMAL HIGH (ref 4.8–5.6)

## 2020-12-31 LAB — TSH: TSH: 2.68 u[IU]/mL (ref 0.450–4.500)

## 2020-12-31 LAB — PSA: Prostate Specific Ag, Serum: 0.8 ng/mL (ref 0.0–4.0)

## 2021-01-11 ENCOUNTER — Other Ambulatory Visit: Payer: Self-pay

## 2021-01-14 ENCOUNTER — Telehealth: Payer: Self-pay

## 2021-01-14 NOTE — Telephone Encounter (Signed)
I left Mrs. Revelle a message that the pt's samples of Marcelline Deist is ready for pickup.

## 2021-01-18 ENCOUNTER — Other Ambulatory Visit: Payer: PRIVATE HEALTH INSURANCE

## 2021-01-18 ENCOUNTER — Other Ambulatory Visit: Payer: Self-pay

## 2021-01-18 ENCOUNTER — Ambulatory Visit (INDEPENDENT_AMBULATORY_CARE_PROVIDER_SITE_OTHER): Payer: PRIVATE HEALTH INSURANCE

## 2021-01-18 VITALS — BP 116/82 | Temp 98.1°F | Wt 160.0 lb

## 2021-01-18 DIAGNOSIS — Z23 Encounter for immunization: Secondary | ICD-10-CM

## 2021-01-18 DIAGNOSIS — E1165 Type 2 diabetes mellitus with hyperglycemia: Secondary | ICD-10-CM

## 2021-01-26 ENCOUNTER — Other Ambulatory Visit: Payer: Self-pay

## 2021-01-26 ENCOUNTER — Other Ambulatory Visit: Payer: Self-pay | Admitting: Internal Medicine

## 2021-01-26 ENCOUNTER — Other Ambulatory Visit: Payer: PRIVATE HEALTH INSURANCE

## 2021-01-26 DIAGNOSIS — E1165 Type 2 diabetes mellitus with hyperglycemia: Secondary | ICD-10-CM

## 2021-01-27 LAB — GLUTAMIC ACID DECARBOXYLASE AUTO ABS: Glutamic Acid Decarb Ab: <5 U/mL (ref 0.0–5.0)

## 2021-02-01 LAB — INSULIN ANTIBODIES, BLOOD: Insulin AutoAb: <5 uU/mL

## 2021-04-04 LAB — MODY 4-GENE PANEL

## 2021-04-05 ENCOUNTER — Other Ambulatory Visit: Payer: Self-pay | Admitting: Internal Medicine

## 2021-04-10 ENCOUNTER — Other Ambulatory Visit: Payer: Self-pay | Admitting: Internal Medicine

## 2021-05-08 ENCOUNTER — Other Ambulatory Visit: Payer: Self-pay | Admitting: Internal Medicine

## 2021-05-17 ENCOUNTER — Telehealth: Payer: Self-pay

## 2021-05-17 NOTE — Telephone Encounter (Signed)
Pa completed for kombeglize and approved. Patient notified.

## 2021-09-05 ENCOUNTER — Other Ambulatory Visit: Payer: Self-pay | Admitting: Internal Medicine

## 2021-09-19 ENCOUNTER — Other Ambulatory Visit: Payer: Self-pay | Admitting: Internal Medicine

## 2022-01-04 ENCOUNTER — Other Ambulatory Visit: Payer: Self-pay | Admitting: Internal Medicine

## 2022-02-04 ENCOUNTER — Other Ambulatory Visit: Payer: Self-pay | Admitting: Internal Medicine

## 2022-04-10 ENCOUNTER — Other Ambulatory Visit: Payer: Self-pay | Admitting: Internal Medicine

## 2022-04-27 ENCOUNTER — Other Ambulatory Visit: Payer: Self-pay | Admitting: Internal Medicine

## 2022-05-28 IMAGING — MG DIGITAL DIAGNOSTIC BILAT W/ TOMO W/ CAD
6 of 10 series · 6 of 30 positions shown · non-contrast
Comparison: None.

CLINICAL DATA: Patient presents with a palpable lump in the
retroareolar right breast, painless and noted for a few weeks.

EXAM:
DIGITAL DIAGNOSTIC BILATERAL MAMMOGRAM WITH CAD AND TOMO
ULTRASOUND RIGHT BREAST

[L CC synth-2D]
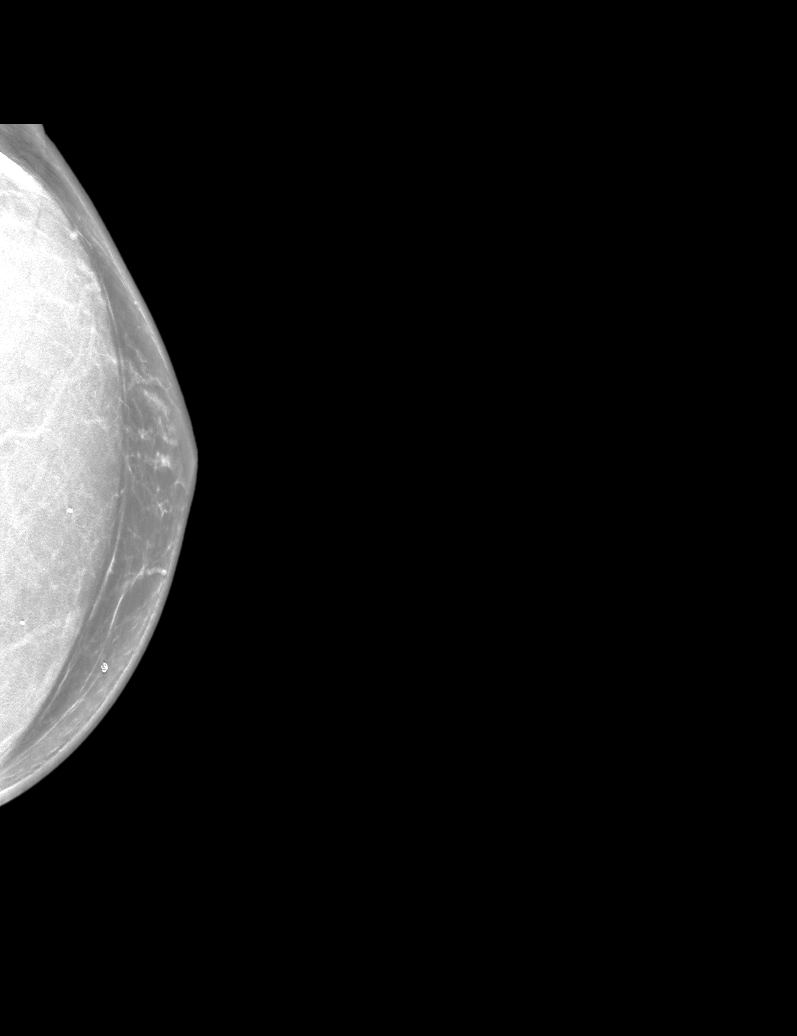

[R MLO synth-2D (1 of 2)]
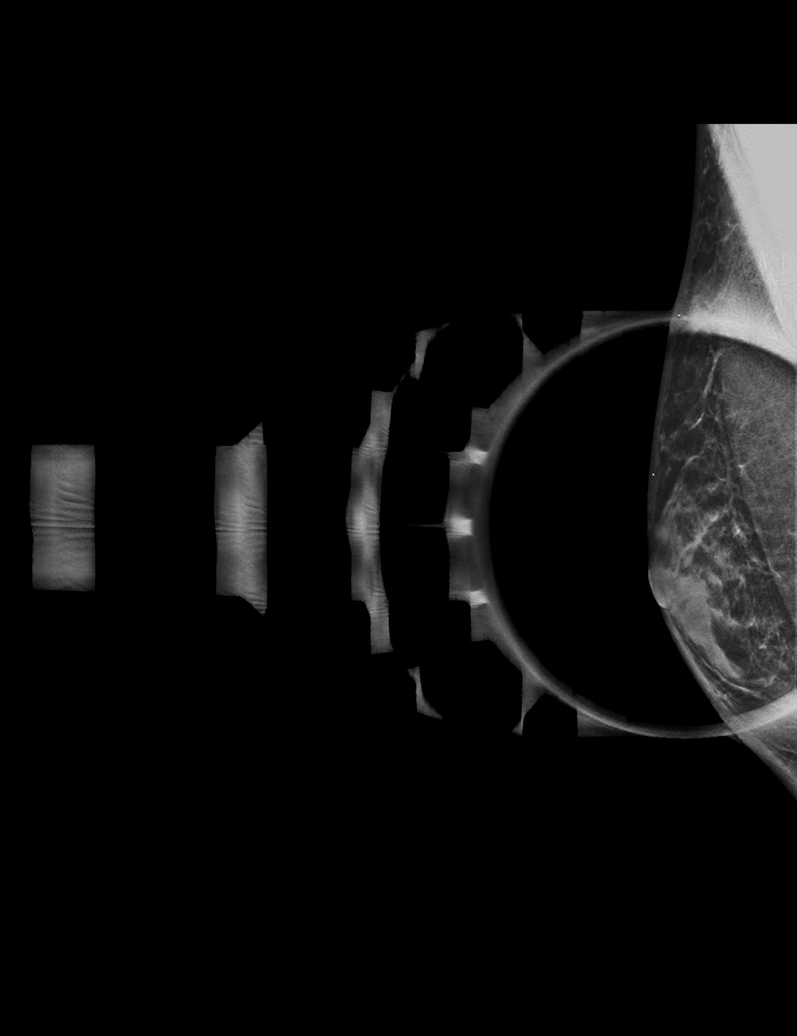

[R MLO synth-2D (2 of 2)]
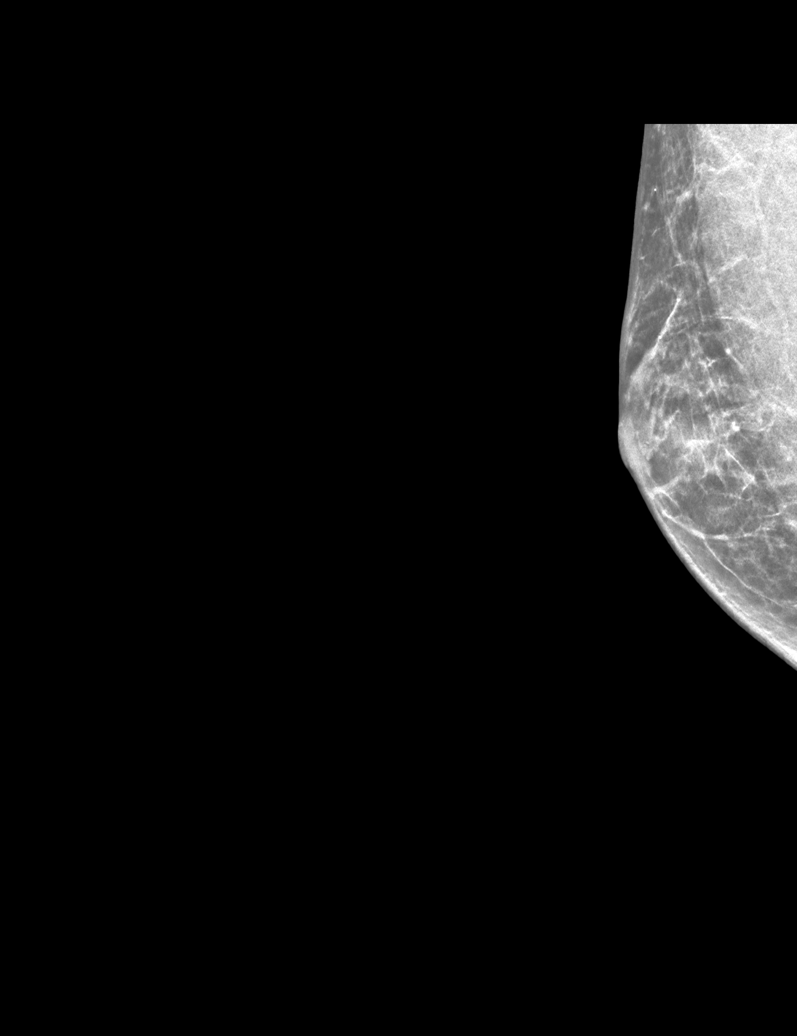

[L MLO synth-2D]
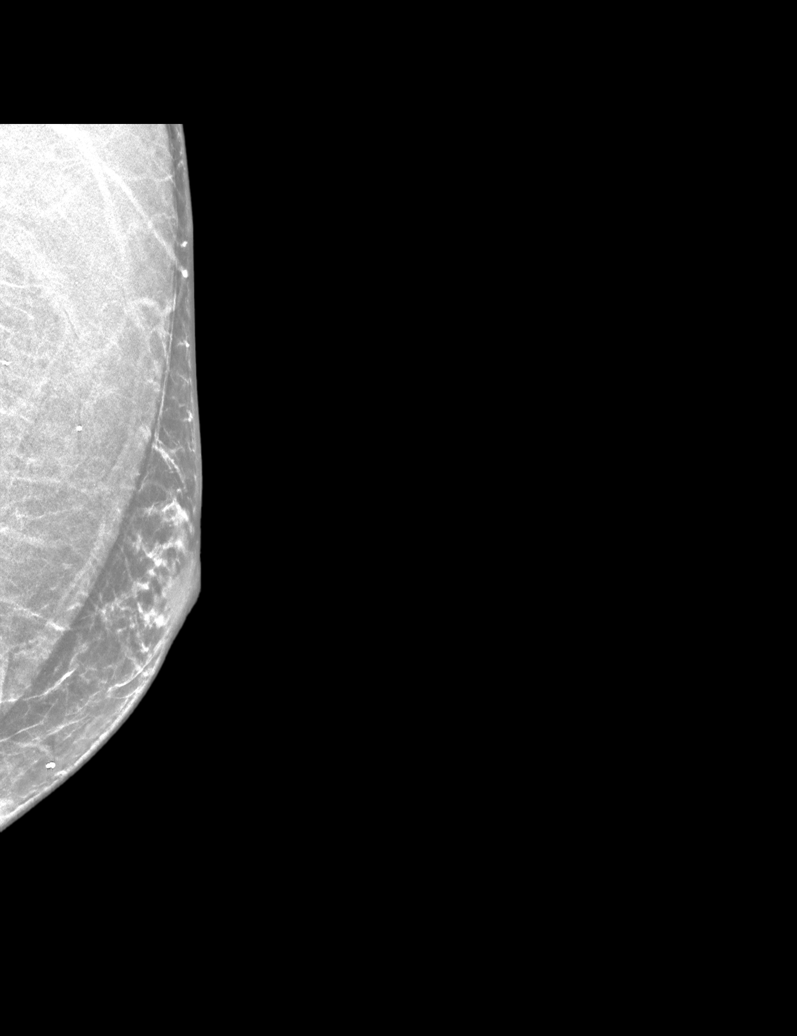

[R CC synth-2D]
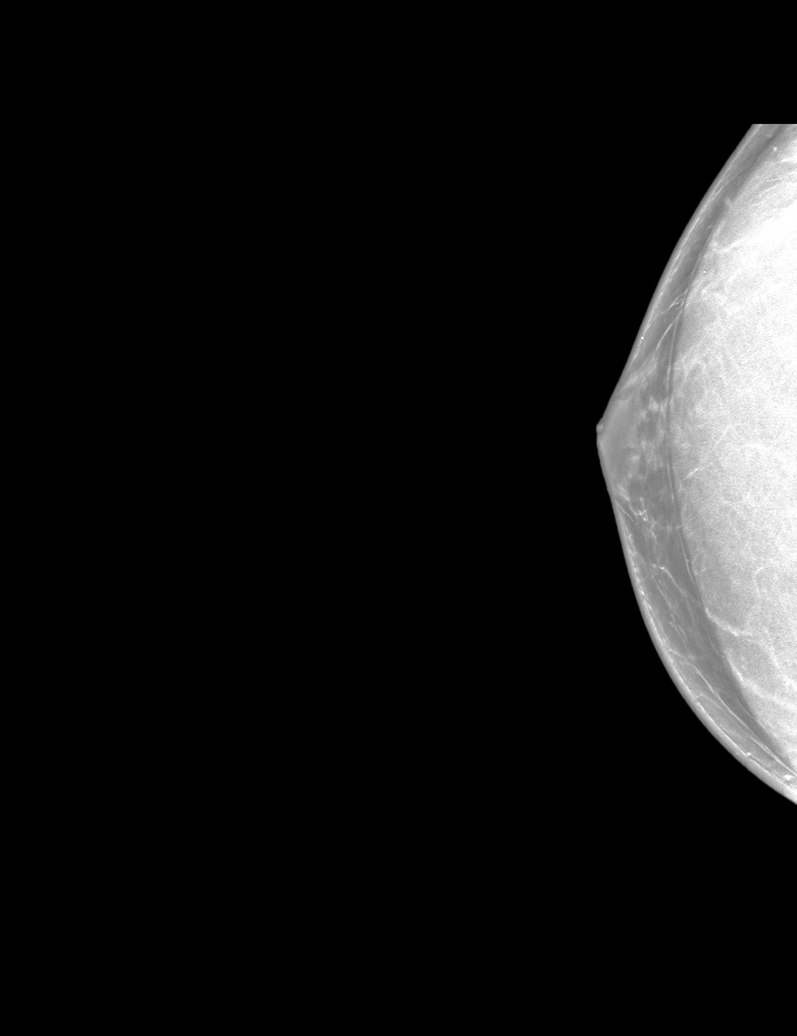

[L CC tomo · tomo slice 29/56.0]
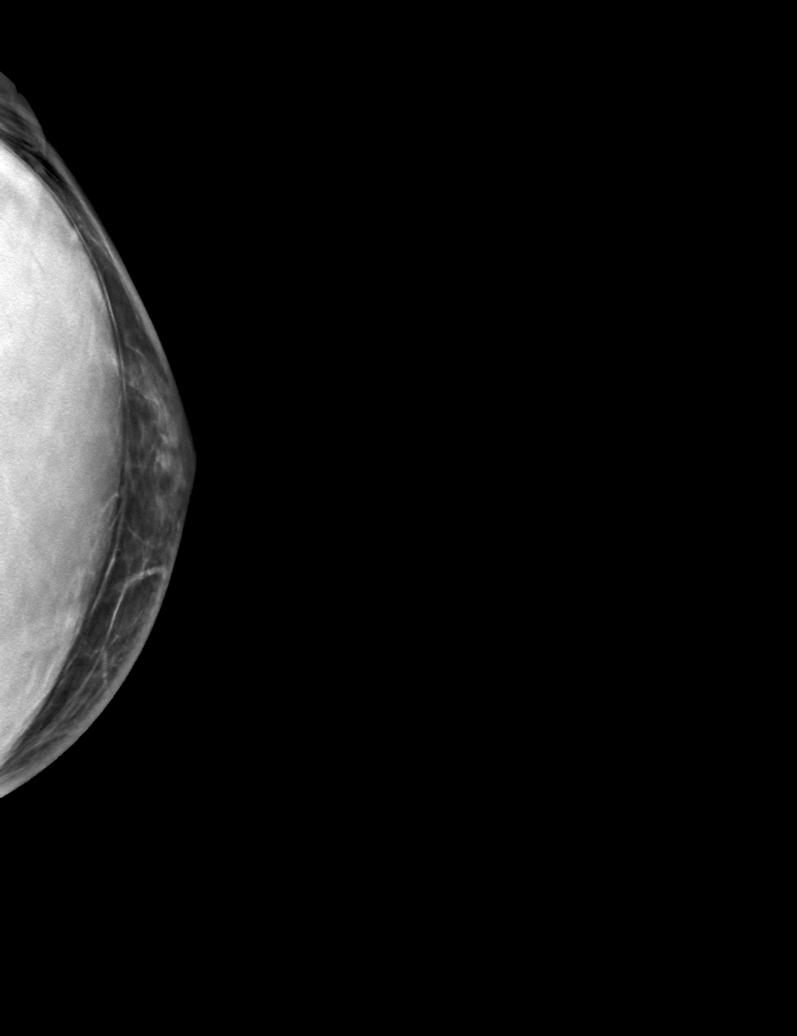

[6 of 30 positions shown; findings below may reference images not displayed]

ACR Breast Density Category b: There are scattered areas of
fibroglandular density.
FINDINGS: There is focal increased attenuation in the retroareolar right
breast, which has an ill-defined mass-like configuration. Additional
increased attenuation is noted in the retroareolar regions of both
breasts, greater on the right, with a pattern typical gynecomastia.
There are no areas of architectural distortion no suspicious
calcifications.

Mammographic images were processed with CAD.

On physical exam, there is a discrete superficial nodule
retroareolar, 9 o'clock position of the right breast, nontender.

Targeted ultrasound is performed, showing a focal hypoechoic
mass-like lesion in the immediate subcutaneous tissue of the right
retroareolar breast at 9 o'clock, with internal blood flow on color
Doppler analysis, measuring approximately 15 x 6 x 9 mm.

Sonographic evaluation of the right axilla shows no enlarged or
abnormal lymph nodes.
IMPRESSION: 1. Indeterminate retroareolar mass in the right breast at 9 o'clock.
Although this is likely gynecomastia, tissue sampling is
recommended.

RECOMMENDATION:
1. Ultrasound-guided core needle biopsy of the 9 o'clock
retroareolar right breast mass like abnormality. This procedure was
scheduled prior to the patient leaving the [REDACTED]

I have discussed the findings and recommendations with the patient.
If applicable, a reminder letter will be sent to the patient
regarding the next appointment.

BI-RADS CATEGORY  4: Suspicious.

## 2022-06-04 IMAGING — US US  BREAST BX W/ LOC DEV 1ST LESION IMG BX SPEC US GUIDE*R*
1 series · 9 of 9 positions shown · non-contrast
Comparison: Previous exam(s).
COMPARISON: Previous exam(s).

Addendum:
CLINICAL DATA: 58-year-old male for tissue sampling of 1.5 cm OUTER
RIGHT breast mass.

EXAM:
ULTRASOUND GUIDED RIGHT BREAST CORE NEEDLE BIOPSY

[Series 1: us breast bx w/ loc dev 1st lesion img bx spec us  · 0.04mm/px · 9 of 9 slices shown]
[im 1/9]
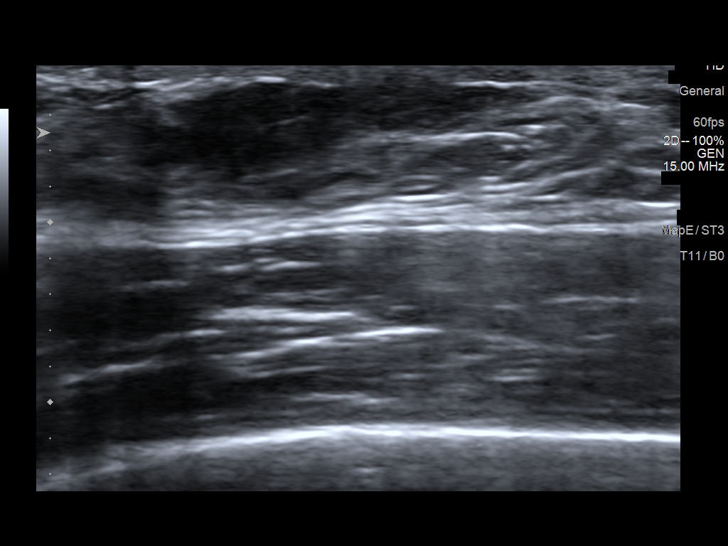
[im 2/9]
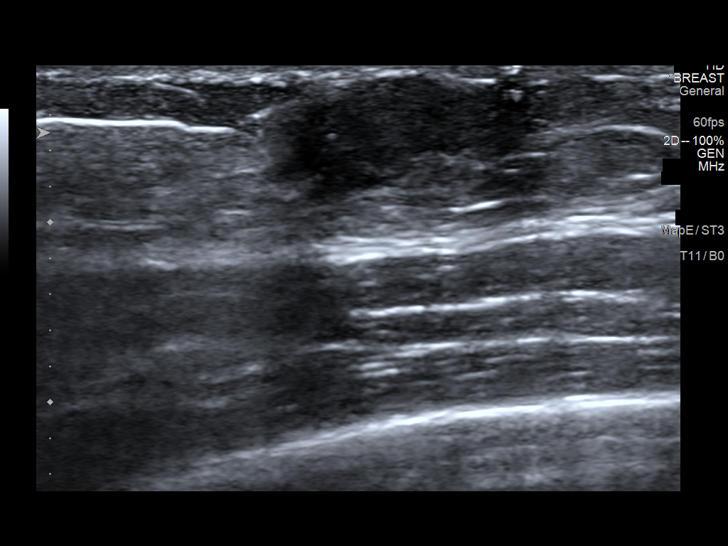
[im 3/9]
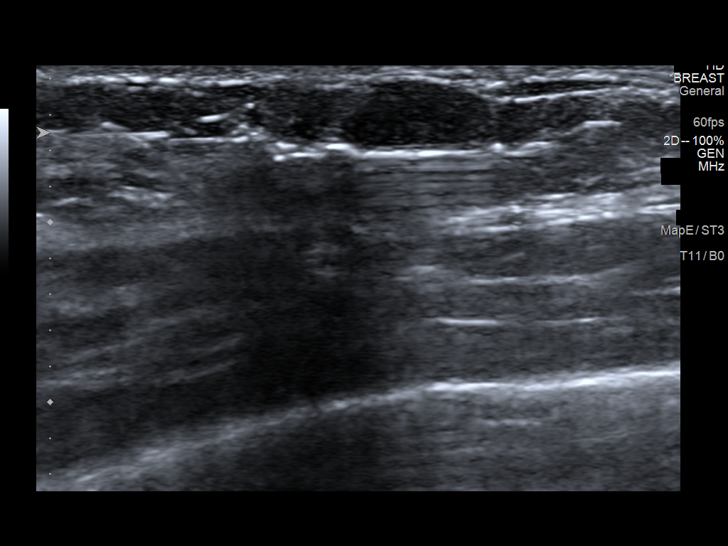
[im 4/9]
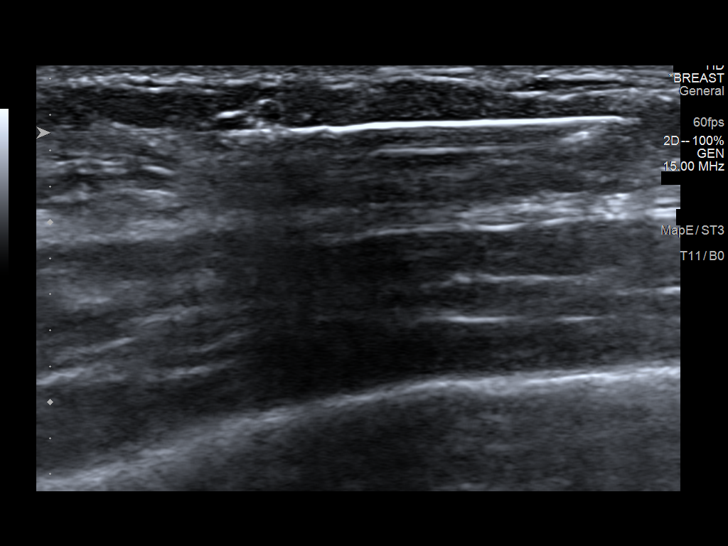
[im 5/9]
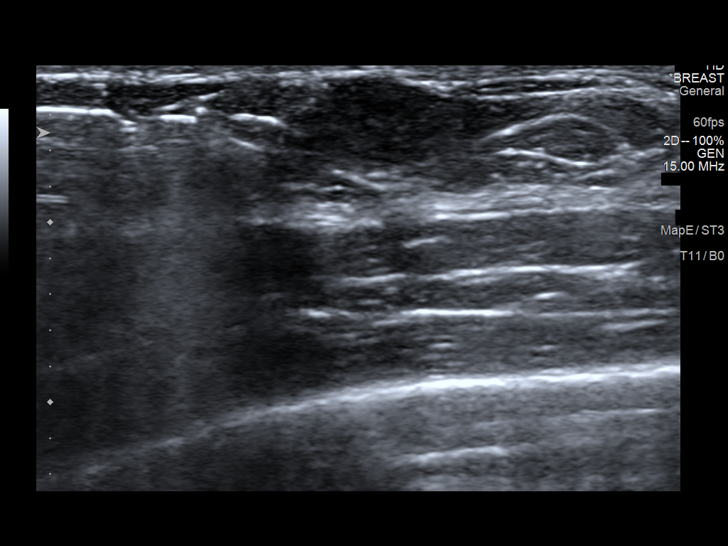
[im 6/9]
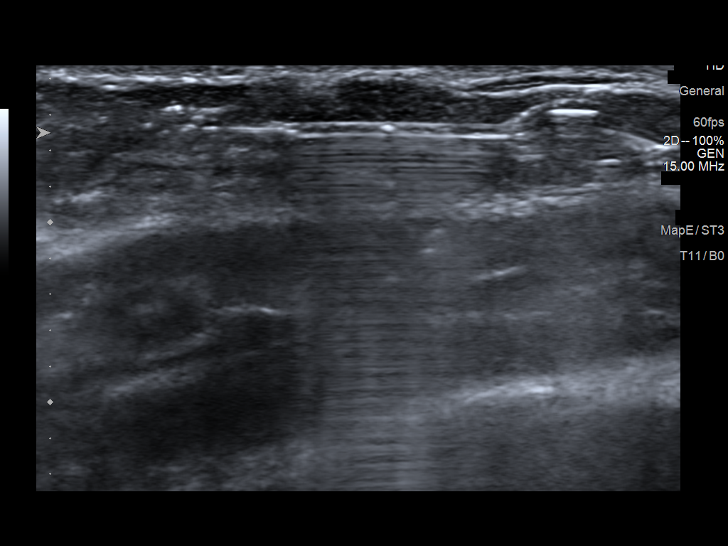
[im 7/9]
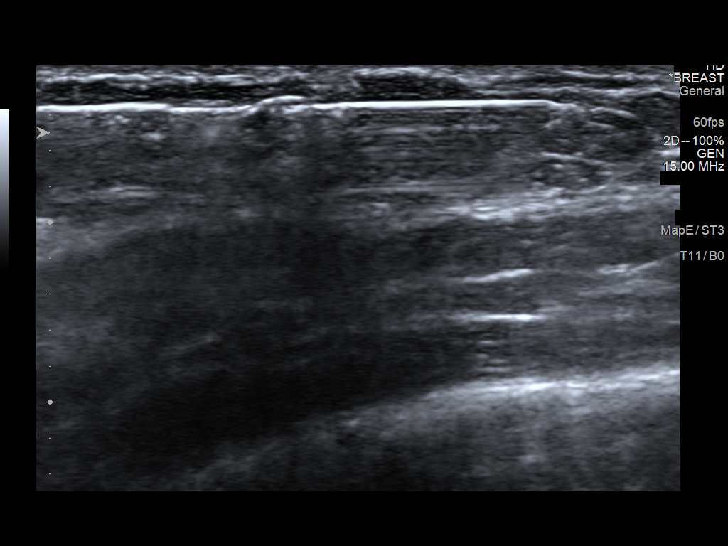
[im 8/9]
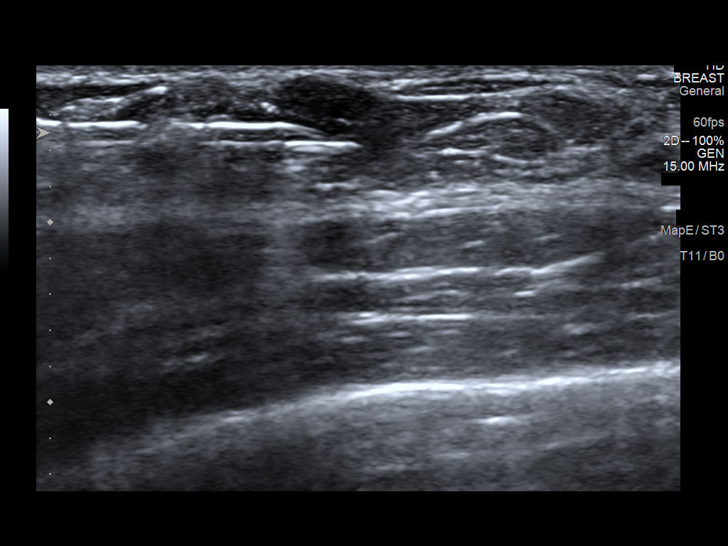
[im 9/9]
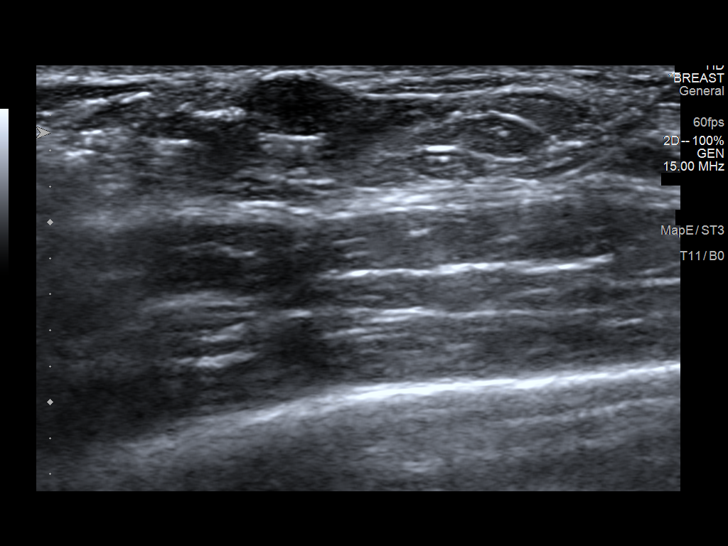

[9 of 9 positions shown; findings below may reference images not displayed]



Using sterile technique and 1% Lidocaine as local anesthetic, under
direct ultrasound visualization, a 14 gauge Delvis device was
used to perform biopsy of the 1.5 cm mass at the 9 o'clock position
of the RETROAREOLAR RIGHT breast using a LATERAL approach. At the
conclusion of the procedure a Q tissue marker clip was deployed into
the biopsy cavity, with satisfactory placement confirmed
sonographically.
IMPRESSION: Ultrasound guided biopsy of 1.5 cm OUTER RETROAREOLAR RIGHT breast
mass. No apparent complication.

ADDENDUM:
Pathology revealed ATYPICAL HISTIOCYTOID PROLIFERATION WITH
HYALINIZED STROMA of the RIGHT breast, 9 o'clock, outer
retroareolar. This was found to be concordant by Dr. Pambinos Iwannidou, with
excision recommended.

Pathology results were discussed with the patient by telephone. The
patient reported doing well after the biopsy with tenderness at the
site. Post biopsy instructions and care were reviewed and questions
were answered. The patient was encouraged to call The [REDACTED] for any additional concerns. My direct phone
number was provided.

The patient will arrange his surgical referral at [REDACTED] in [HOSPITAL][HOSPITAL].

Pathology results reported by Lesya Aujla, RN on 07/19/2020.



Using sterile technique and 1% Lidocaine as local anesthetic, under
direct ultrasound visualization, a 14 gauge Delvis device was
used to perform biopsy of the 1.5 cm mass at the 9 o'clock position
of the RETROAREOLAR RIGHT breast using a LATERAL approach. At the
conclusion of the procedure a Q tissue marker clip was deployed into
the biopsy cavity, with satisfactory placement confirmed
sonographically.
IMPRESSION: Ultrasound guided biopsy of 1.5 cm OUTER RETROAREOLAR RIGHT breast
mass. No apparent complication.

## 2022-06-05 ENCOUNTER — Other Ambulatory Visit: Payer: Self-pay | Admitting: Internal Medicine

## 2022-07-31 ENCOUNTER — Other Ambulatory Visit: Payer: Self-pay | Admitting: Internal Medicine

## 2022-09-04 LAB — HM DIABETES EYE EXAM

## 2022-10-02 ENCOUNTER — Other Ambulatory Visit: Payer: Self-pay | Admitting: Internal Medicine

## 2022-10-24 ENCOUNTER — Other Ambulatory Visit: Payer: Self-pay

## 2022-10-24 MED ORDER — BD PEN NEEDLE NANO 2ND GEN 32G X 4 MM MISC: 11 refills | Status: AC

## 2022-10-29 ENCOUNTER — Other Ambulatory Visit: Payer: Self-pay | Admitting: Internal Medicine

## 2022-12-04 ENCOUNTER — Other Ambulatory Visit: Payer: Self-pay | Admitting: Internal Medicine

## 2023-02-04 ENCOUNTER — Other Ambulatory Visit: Payer: Self-pay | Admitting: Internal Medicine

## 2023-02-04 DIAGNOSIS — E1165 Type 2 diabetes mellitus with hyperglycemia: Secondary | ICD-10-CM

## 2023-02-04 DIAGNOSIS — Z125 Encounter for screening for malignant neoplasm of prostate: Secondary | ICD-10-CM

## 2023-02-08 ENCOUNTER — Other Ambulatory Visit: Payer: PRIVATE HEALTH INSURANCE

## 2023-02-08 DIAGNOSIS — E1165 Type 2 diabetes mellitus with hyperglycemia: Secondary | ICD-10-CM

## 2023-02-08 DIAGNOSIS — Z125 Encounter for screening for malignant neoplasm of prostate: Secondary | ICD-10-CM

## 2023-02-09 ENCOUNTER — Other Ambulatory Visit: Payer: Self-pay | Admitting: Internal Medicine

## 2023-02-09 LAB — CMP14+EGFR
ALT: 13 IU/L (ref 0–44)
AST: 13 IU/L (ref 0–40)
Albumin/Globulin Ratio: 1.4 (ref 1.2–2.2)
Albumin: 4.4 g/dL (ref 3.8–4.9)
Alkaline Phosphatase: 65 IU/L (ref 44–121)
BUN/Creatinine Ratio: 17 (ref 9–20)
BUN: 26 mg/dL — ABNORMAL HIGH (ref 6–24)
Bilirubin Total: 0.3 mg/dL (ref 0.0–1.2)
CO2: 20 mmol/L (ref 20–29)
Calcium: 10.9 mg/dL — ABNORMAL HIGH (ref 8.7–10.2)
Chloride: 106 mmol/L (ref 96–106)
Creatinine, Ser: 1.57 mg/dL — ABNORMAL HIGH (ref 0.76–1.27)
Globulin, Total: 3.1 g/dL (ref 1.5–4.5)
Glucose: 135 mg/dL — ABNORMAL HIGH (ref 70–99)
Potassium: 5.7 mmol/L — ABNORMAL HIGH (ref 3.5–5.2)
Sodium: 143 mmol/L (ref 134–144)
Total Protein: 7.5 g/dL (ref 6.0–8.5)
eGFR: 50 mL/min/{1.73_m2} — ABNORMAL LOW (ref >59)

## 2023-02-09 LAB — CBC
Hematocrit: 44.2 % (ref 37.5–51.0)
Hemoglobin: 14.3 g/dL (ref 13.0–17.7)
MCH: 30.8 pg (ref 26.6–33.0)
MCHC: 32.4 g/dL (ref 31.5–35.7)
MCV: 95 fL (ref 79–97)
Platelets: 242 10*3/uL (ref 150–450)
RBC: 4.65 x10E6/uL (ref 4.14–5.80)
RDW: 12.7 % (ref 11.6–15.4)
WBC: 2.9 10*3/uL — ABNORMAL LOW (ref 3.4–10.8)

## 2023-02-09 LAB — LIPID PANEL
Chol/HDL Ratio: 3.7 ratio (ref 0.0–5.0)
Cholesterol, Total: 244 mg/dL — ABNORMAL HIGH (ref 100–199)
HDL: 66 mg/dL (ref >39)
LDL Chol Calc (NIH): 165 mg/dL — ABNORMAL HIGH (ref 0–99)
Triglycerides: 75 mg/dL (ref 0–149)
VLDL Cholesterol Cal: 13 mg/dL (ref 5–40)

## 2023-02-09 LAB — HEMOGLOBIN A1C
Est. average glucose Bld gHb Est-mCnc: 192 mg/dL
Hgb A1c MFr Bld: 8.3 % — ABNORMAL HIGH (ref 4.8–5.6)

## 2023-02-09 LAB — PSA: Prostate Specific Ag, Serum: 0.9 ng/mL (ref 0.0–4.0)

## 2023-02-09 MED ORDER — RAMIPRIL 2.5 MG PO CAPS: ORAL_CAPSULE | ORAL | 2 refills | Status: DC

## 2023-02-09 MED ORDER — ROSUVASTATIN CALCIUM 10 MG PO TABS: 10.0000 mg | ORAL_TABLET | Freq: Every day | ORAL | 1 refills | Status: DC

## 2023-02-16 ENCOUNTER — Other Ambulatory Visit: Payer: Self-pay | Admitting: Internal Medicine

## 2023-02-16 DIAGNOSIS — N289 Disorder of kidney and ureter, unspecified: Secondary | ICD-10-CM

## 2023-03-19 ENCOUNTER — Other Ambulatory Visit: Payer: Self-pay

## 2023-03-19 DIAGNOSIS — E1165 Type 2 diabetes mellitus with hyperglycemia: Secondary | ICD-10-CM

## 2023-03-19 MED ORDER — DAPAGLIFLOZIN PROPANEDIOL 10 MG PO TABS
10.0000 mg | ORAL_TABLET | Freq: Every day | ORAL | 1 refills | Status: DC
Start: 2023-03-19 — End: 2023-07-25

## 2023-03-30 ENCOUNTER — Telehealth: Payer: Self-pay

## 2023-03-30 NOTE — Telephone Encounter (Signed)
PA submitted for Farxiga. Waiting for questions to send to plan.

## 2023-05-26 ENCOUNTER — Other Ambulatory Visit: Payer: Self-pay | Admitting: Internal Medicine

## 2023-06-05 ENCOUNTER — Encounter: Payer: Self-pay | Admitting: Internal Medicine

## 2023-06-08 ENCOUNTER — Other Ambulatory Visit: Payer: Self-pay | Admitting: Internal Medicine

## 2023-06-25 ENCOUNTER — Other Ambulatory Visit: Payer: Self-pay | Admitting: Internal Medicine

## 2023-06-25 DIAGNOSIS — E1165 Type 2 diabetes mellitus with hyperglycemia: Secondary | ICD-10-CM

## 2023-07-25 ENCOUNTER — Other Ambulatory Visit: Payer: Self-pay | Admitting: Internal Medicine

## 2023-07-25 DIAGNOSIS — E1165 Type 2 diabetes mellitus with hyperglycemia: Secondary | ICD-10-CM

## 2023-07-25 MED ORDER — DAPAGLIFLOZIN PROPANEDIOL 10 MG PO TABS: 10.0000 mg | ORAL_TABLET | Freq: Every day | ORAL | 1 refills | Status: DC

## 2023-08-19 ENCOUNTER — Other Ambulatory Visit: Payer: Self-pay | Admitting: Internal Medicine

## 2023-09-05 ENCOUNTER — Encounter: Payer: Self-pay | Admitting: Internal Medicine

## 2023-09-05 ENCOUNTER — Ambulatory Visit: Payer: BC Managed Care – PPO | Admitting: Internal Medicine

## 2023-09-05 VITALS — BP 108/70 | HR 89 | Temp 98.2°F | Ht 69.0 in | Wt 152.8 lb

## 2023-09-05 DIAGNOSIS — E1165 Type 2 diabetes mellitus with hyperglycemia: Secondary | ICD-10-CM

## 2023-09-05 DIAGNOSIS — Z0000 Encounter for general adult medical examination without abnormal findings: Secondary | ICD-10-CM

## 2023-09-05 LAB — POCT URINALYSIS DIPSTICK
Bilirubin, UA: NEGATIVE
Blood, UA: NEGATIVE
Glucose, UA: POSITIVE — AB
Leukocytes, UA: NEGATIVE
Nitrite, UA: NEGATIVE
Protein, UA: NEGATIVE
Spec Grav, UA: 1.015 (ref 1.010–1.025)
Urobilinogen, UA: 0.2 U/dL
pH, UA: 5 (ref 5.0–8.0)

## 2023-09-05 LAB — HEMOCCULT GUIAC POC 1CARD (OFFICE): Fecal Occult Blood, POC: NEGATIVE

## 2023-09-05 MED ORDER — ROSUVASTATIN CALCIUM 10 MG PO TABS: 10.0000 mg | ORAL_TABLET | Freq: Every day | ORAL | 2 refills | Status: DC

## 2023-09-05 MED ORDER — RAMIPRIL 2.5 MG PO CAPS: ORAL_CAPSULE | ORAL | 2 refills | Status: DC

## 2023-09-05 NOTE — Patient Instructions (Signed)
Health Maintenance, Male Adopting a healthy lifestyle and getting preventive care are important in promoting health and wellness. Ask your health care provider about: The right schedule for you to have regular tests and exams. Things you can do on your own to prevent diseases and keep yourself healthy. What should I know about diet, weight, and exercise? Eat a healthy diet  Eat a diet that includes plenty of vegetables, fruits, low-fat dairy products, and lean protein. Do not eat a lot of foods that are high in solid fats, added sugars, or sodium. Maintain a healthy weight Body mass index (BMI) is a measurement that can be used to identify possible weight problems. It estimates body fat based on height and weight. Your health care provider can help determine your BMI and help you achieve or maintain a healthy weight. Get regular exercise Get regular exercise. This is one of the most important things you can do for your health. Most adults should: Exercise for at least 150 minutes each week. The exercise should increase your heart rate and make you sweat (moderate-intensity exercise). Do strengthening exercises at least twice a week. This is in addition to the moderate-intensity exercise. Spend less time sitting. Even light physical activity can be beneficial. Watch cholesterol and blood lipids Have your blood tested for lipids and cholesterol at 60 years of age, then have this test every 5 years. You may need to have your cholesterol levels checked more often if: Your lipid or cholesterol levels are high. You are older than 60 years of age. You are at high risk for heart disease. What should I know about cancer screening? Many types of cancers can be detected early and may often be prevented. Depending on your health history and family history, you may need to have cancer screening at various ages. This may include screening for: Colorectal cancer. Prostate cancer. Skin cancer. Lung  cancer. What should I know about heart disease, diabetes, and high blood pressure? Blood pressure and heart disease High blood pressure causes heart disease and increases the risk of stroke. This is more likely to develop in people who have high blood pressure readings or are overweight. Talk with your health care provider about your target blood pressure readings. Have your blood pressure checked: Every 3-5 years if you are 18-39 years of age. Every year if you are 40 years old or older. If you are between the ages of 65 and 75 and are a current or former smoker, ask your health care provider if you should have a one-time screening for abdominal aortic aneurysm (AAA). Diabetes Have regular diabetes screenings. This checks your fasting blood sugar level. Have the screening done: Once every three years after age 45 if you are at a normal weight and have a low risk for diabetes. More often and at a younger age if you are overweight or have a high risk for diabetes. What should I know about preventing infection? Hepatitis B If you have a higher risk for hepatitis B, you should be screened for this virus. Talk with your health care provider to find out if you are at risk for hepatitis B infection. Hepatitis C Blood testing is recommended for: Everyone born from 1945 through 1965. Anyone with known risk factors for hepatitis C. Sexually transmitted infections (STIs) You should be screened each year for STIs, including gonorrhea and chlamydia, if: You are sexually active and are younger than 60 years of age. You are older than 60 years of age and your   health care provider tells you that you are at risk for this type of infection. Your sexual activity has changed since you were last screened, and you are at increased risk for chlamydia or gonorrhea. Ask your health care provider if you are at risk. Ask your health care provider about whether you are at high risk for HIV. Your health care provider  may recommend a prescription medicine to help prevent HIV infection. If you choose to take medicine to prevent HIV, you should first get tested for HIV. You should then be tested every 3 months for as long as you are taking the medicine. Follow these instructions at home: Alcohol use Do not drink alcohol if your health care provider tells you not to drink. If you drink alcohol: Limit how much you have to 0-2 drinks a day. Know how much alcohol is in your drink. In the [place] one drink equals one 12 oz bottle of beer (355 mL), one 5 oz glass of wine (148 mL), or one 1 oz glass of hard liquor (44 mL). Lifestyle Do not use any products that contain nicotine or tobacco. These products include cigarettes, chewing tobacco, and vaping devices, such as e-cigarettes. If you need help quitting, ask your health care provider. Do not use street drugs. Do not share needles. Ask your health care provider for help if you need support or information about quitting drugs. General instructions Schedule regular health, dental, and eye exams. Stay current with your vaccines. Tell your health care provider if: You often feel depressed. You have ever been abused or do not feel safe at home. Summary Adopting a healthy lifestyle and getting preventive care are important in promoting health and wellness. Follow your health care provider's instructions about healthy diet, exercising, and getting tested or screened for diseases. Follow your health care provider's instructions on monitoring your cholesterol and blood pressure. This information is not intended to replace advice given to you by your health care provider. Make sure you discuss any questions you have with your health care provider. Document Revised: 02/21/2021 Document Reviewed: 02/21/2021 Elsevier Patient Education  2024 [employer]  

## 2023-09-05 NOTE — Progress Notes (Signed)
I,Allen Arnold, CMA,acting as a Neurosurgeon for Allen Aliment, MD.,have documented all relevant documentation on the behalf of Allen Aliment, MD,as directed by  Allen Aliment, MD while in the presence of Allen Aliment, MD.  Subjective:   Patient ID: Allen Arnold, Dr. , male    DOB: August 27, 1961 , 62 y.o.   MRN: [id number]  Chief Complaint  Patient presents with   Annual Exam   Diabetes    HPI  Patient presents today for annual exam. He reports compliance with medications. Denies headache, chest pain & sob. He denies having any specific questions or concerns.  Diabetes He presents for his follow-up diabetic visit. He has type 2 diabetes mellitus. His disease course has been stable. Pertinent negatives for diabetes include no chest pain, no fatigue, no foot paresthesias, no foot ulcerations and no visual change. Diabetic complications include nephropathy. Risk factors for coronary artery disease include diabetes mellitus, dyslipidemia and male sex. Current diabetic treatment includes oral agent (triple therapy).     Past Medical History:  Diagnosis Date   Diabetes mellitus without complication (HCC)    Hyperlipidemia    on crestor   Inguinal hernia    left     Family History  Problem Relation Age of Onset   Diabetes Father    Heart disease Mother      Current Outpatient Medications:    aspirin 81 MG chewable tablet, Chew by mouth daily., Disp: , Rfl:    Continuous Glucose Sensor (FREESTYLE LIBRE 14 DAY SENSOR) MISC, USE AS DIRECTED TO CHECK BLOOD SUGARS 4 TIMES PER DAY DX: E11.65, Disp: 2 each, Rfl: 8   dapagliflozin propanediol (FARXIGA) 10 MG TABS tablet, Take 1 tablet (10 mg total) by mouth daily before breakfast., Disp: 90 tablet, Rfl: 1   Saxagliptin-Metformin 2.02-999 MG TB24, TAKE 1 TABLET BY MOUTH TWICE A DAY, Disp: 60 tablet, Rfl: 20   insulin degludec (TRESIBA FLEXTOUCH) 100 UNIT/ML FlexTouch Pen, INJECT 20 UNITS TOTAL INTO THE SKIN DAILY. TITRATE TO MAX DOSE 60  UNITS NIGHTLY (Patient not taking: Reported on 09/05/2023), Disp: 3 mL, Rfl: 3   Insulin Pen Needle (BD PEN NEEDLE NANO 2ND GEN) 32G X 4 MM MISC, Use as directed (Patient not taking: Reported on 09/05/2023), Disp: 100 each, Rfl: 11   ramipril (ALTACE) 2.5 MG capsule, TAKE 1 CAPSULE BY MOUTH EVERY DAY IN THE MORNING, Disp: 90 capsule, Rfl: 2   rosuvastatin (CRESTOR) 10 MG tablet, Take 1 tablet (10 mg total) by mouth daily., Disp: 90 tablet, Rfl: 2   No Known Allergies   Men's preventive visit. Patient Health Questionnaire (PHQ-2) is  Flowsheet Row Office Visit from 09/05/2023 in [facility]  PHQ-2 Total Score 0     . Patient is on a healthy, diabetic  diet. Marital status: Married. Relevant history for alcohol use is:  Social History   Substance and Sexual Activity  Alcohol Use No  . Relevant history for tobacco use is:  Social History   Tobacco Use  Smoking Status Never  Smokeless Tobacco Never  .   Review of Systems  Constitutional: Negative.  Negative for fatigue.  HENT: Negative.    Eyes: Negative.   Respiratory: Negative.    Cardiovascular:  Negative for chest pain.  Gastrointestinal: Negative.   Endocrine: Negative.   Genitourinary: Negative.   Musculoskeletal: Negative.   Skin: Negative.   Allergic/Immunologic: Negative.   Neurological: Negative.   Hematological: Negative.      Today's  Vitals   09/05/23 0955  BP: 108/70  Pulse: 89  Temp: 98.2 F (36.8 C)  SpO2: 98%  Weight: 152 lb 12.8 oz (69.3 kg)  Height: 5\' 9"  (1.753 m)   Body mass index is 22.56 kg/m.  Wt Readings from Last 3 Encounters:  09/11/23 152 lb (68.9 kg)  09/05/23 152 lb 12.8 oz (69.3 kg)  01/18/21 160 lb (72.6 kg)    Objective:  Physical Exam Vitals and nursing note reviewed.  Constitutional:      Appearance: Normal appearance.  HENT:     Head: Normocephalic and atraumatic.     Right Ear: Tympanic membrane, ear canal and external ear normal.      Left Ear: Tympanic membrane, ear canal and external ear normal.     Nose: Nose normal.     Mouth/Throat:     Mouth: Mucous membranes are moist.     Pharynx: Oropharynx is clear.  Eyes:     Extraocular Movements: Extraocular movements intact.     Conjunctiva/sclera: Conjunctivae normal.     Pupils: Pupils are equal, round, and reactive to light.  Cardiovascular:     Rate and Rhythm: Normal rate and regular rhythm.     Pulses: Normal pulses.          Dorsalis pedis pulses are 2+ on the right side and 2+ on the left side.     Heart sounds: Normal heart sounds.  Pulmonary:     Effort: Pulmonary effort is normal.     Breath sounds: Normal breath sounds.  Chest:  Breasts:    Right: Normal. No swelling, bleeding, inverted nipple, mass or nipple discharge.     Left: Normal. No swelling, bleeding, inverted nipple, mass or nipple discharge.  Abdominal:     General: Abdomen is flat. Bowel sounds are normal.     Palpations: Abdomen is soft.  Genitourinary:    Prostate: Normal.     Rectum: Normal. Guaiac result negative.  Musculoskeletal:        General: Normal range of motion.     Cervical back: Normal range of motion and neck supple.  Feet:     Right foot:     Protective Sensation: 5 sites tested.  5 sites sensed.     Skin integrity: Skin integrity normal.     Toenail Condition: Right toenails are normal.     Left foot:     Protective Sensation: 5 sites tested.  5 sites sensed.     Skin integrity: Skin integrity normal.     Toenail Condition: Left toenails are normal.  Skin:    General: Skin is warm.  Neurological:     General: No focal deficit present.     Mental Status: He is alert.  Psychiatric:        Mood and Affect: Mood normal.        Behavior: Behavior normal.         Assessment And Plan:    Encounter for annual health examination Assessment & Plan: A full exam was performed.  DRE performed, stool is heme negative.  He is advised to get 30-45 minutes of regular  exercise, no less than four to five days per week. Both weight-bearing and aerobic exercises are recommended.  He is advised to follow a healthy diet with at least six fruits/veggies per day, decrease intake of red meat and other saturated fats and to increase fish intake to twice weekly.  Meats/fish should not be fried -- baked, boiled or broiled is  preferable. It is also important to cut back on your sugar intake.  Be sure to read labels - try to avoid anything with added sugar, high fructose corn syrup or other sweeteners.  If you must use a sweetener, you can try stevia or monkfruit.  It is also important to avoid artificially sweetened foods/beverages and diet drinks. Lastly, wear SPF 50 sunscreen on exposed skin and when in direct sunlight for an extended period of time.  Be sure to avoid fast food restaurants and aim for at least 60 ounces of water daily.      Orders: -     CMP14+EGFR -     Lipid panel -     Hemoglobin A1c -     CBC -     TSH -     PSA -     POCT occult blood stool  Uncontrolled type 2 diabetes mellitus with hyperglycemia (HCC) Assessment & Plan: Chronic, diabetic foot exam was performed. He will continue with [place] 10mg  daily along with Kombiglyze 2.5/1000mg  twice daily. He will rto in 3 months for re-evaluation. I DISCUSSED WITH THE PATIENT AT LENGTH REGARDING THE GOALS OF GLYCEMIC CONTROL AND POSSIBLE LONG-TERM COMPLICATIONS.  I  ALSO STRESSED THE IMPORTANCE OF COMPLIANCE WITH HOME GLUCOSE MONITORING, DIETARY RESTRICTIONS INCLUDING AVOIDANCE OF SUGARY DRINKS/PROCESSED FOODS,  ALONG WITH REGULAR EXERCISE.  I  ALSO STRESSED THE IMPORTANCE OF ANNUAL EYE EXAMS, SELF FOOT CARE AND COMPLIANCE WITH OFFICE VISITS.   Orders: -     POCT urinalysis dipstick  Other orders -     Rosuvastatin Calcium; Take 1 tablet (10 mg total) by mouth daily.  Dispense: 90 tablet; Refill: 2 -     Ramipril; TAKE 1 CAPSULE BY MOUTH EVERY DAY IN THE MORNING  Dispense: 90 capsule; Refill:  2     Return next Tuesday- shingles vaccine, for 1 year physical, 3 month dm check. Patient was given opportunity to ask questions. Patient verbalized understanding of the plan and was able to repeat key elements of the plan. All questions were answered to their satisfaction.   I, Allen Aliment, MD, have reviewed all documentation for this visit. The documentation on 09/05/23 for the exam, diagnosis, procedures, and orders are all accurate and complete.

## 2023-09-05 NOTE — Assessment & Plan Note (Signed)
Chronic, diabetic foot exam was performed. He will continue with [place] 10mg  daily along with Kombiglyze 2.5/1000mg  twice daily. He will rto in 3 months for re-evaluation. I DISCUSSED WITH THE PATIENT AT LENGTH REGARDING THE GOALS OF GLYCEMIC CONTROL AND POSSIBLE LONG-TERM COMPLICATIONS.  I  ALSO STRESSED THE IMPORTANCE OF COMPLIANCE WITH HOME GLUCOSE MONITORING, DIETARY RESTRICTIONS INCLUDING AVOIDANCE OF SUGARY DRINKS/PROCESSED FOODS,  ALONG WITH REGULAR EXERCISE.  I  ALSO STRESSED THE IMPORTANCE OF ANNUAL EYE EXAMS, SELF FOOT CARE AND COMPLIANCE WITH OFFICE VISITS.

## 2023-09-05 NOTE — Assessment & Plan Note (Signed)
A full exam was performed.  DRE performed, stool is heme negative.  He is advised to get 30-45 minutes of regular exercise, no less than four to five days per week. Both weight-bearing and aerobic exercises are recommended.  He is advised to follow a healthy diet with at least six fruits/veggies per day, decrease intake of red meat and other saturated fats and to increase fish intake to twice weekly.  Meats/fish should not be fried -- baked, boiled or broiled is preferable. It is also important to cut back on your sugar intake.  Be sure to read labels - try to avoid anything with added sugar, high fructose corn syrup or other sweeteners.  If you must use a sweetener, you can try stevia or monkfruit.  It is also important to avoid artificially sweetened foods/beverages and diet drinks. Lastly, wear SPF 50 sunscreen on exposed skin and when in direct sunlight for an extended period of time.  Be sure to avoid fast food restaurants and aim for at least 60 ounces of water daily.

## 2023-09-06 ENCOUNTER — Other Ambulatory Visit: Payer: Self-pay | Admitting: Internal Medicine

## 2023-09-06 DIAGNOSIS — N1831 Chronic kidney disease, stage 3a: Secondary | ICD-10-CM

## 2023-09-06 LAB — CMP14+EGFR
ALT: 12 [IU]/L (ref 0–44)
AST: 14 [IU]/L (ref 0–40)
Albumin: 4.4 g/dL (ref 3.8–4.9)
Alkaline Phosphatase: 78 [IU]/L (ref 44–121)
BUN/Creatinine Ratio: 16 (ref 10–24)
BUN: 28 mg/dL — ABNORMAL HIGH (ref 8–27)
Bilirubin Total: 0.3 mg/dL (ref 0.0–1.2)
CO2: 21 mmol/L (ref 20–29)
Calcium: 10.2 mg/dL (ref 8.6–10.2)
Chloride: 104 mmol/L (ref 96–106)
Creatinine, Ser: 1.73 mg/dL — ABNORMAL HIGH (ref 0.76–1.27)
Globulin, Total: 3.2 g/dL (ref 1.5–4.5)
Glucose: 172 mg/dL — ABNORMAL HIGH (ref 70–99)
Potassium: 5.3 mmol/L — ABNORMAL HIGH (ref 3.5–5.2)
Sodium: 139 mmol/L (ref 134–144)
Total Protein: 7.6 g/dL (ref 6.0–8.5)
eGFR: 45 mL/min/{1.73_m2} — ABNORMAL LOW (ref >59)

## 2023-09-06 LAB — CBC
Hematocrit: 41.4 % (ref 37.5–51.0)
Hemoglobin: 13.4 g/dL (ref 13.0–17.7)
MCH: 30.8 pg (ref 26.6–33.0)
MCHC: 32.4 g/dL (ref 31.5–35.7)
MCV: 95 fL (ref 79–97)
Platelets: 272 10*3/uL (ref 150–450)
RBC: 4.35 x10E6/uL (ref 4.14–5.80)
RDW: 12.6 % (ref 11.6–15.4)
WBC: 5.8 10*3/uL (ref 3.4–10.8)

## 2023-09-06 LAB — LIPID PANEL
Chol/HDL Ratio: 2.8 ratio (ref 0.0–5.0)
Cholesterol, Total: 164 mg/dL (ref 100–199)
HDL: 58 mg/dL (ref >39)
LDL Chol Calc (NIH): 90 mg/dL (ref 0–99)
Triglycerides: 83 mg/dL (ref 0–149)
VLDL Cholesterol Cal: 16 mg/dL (ref 5–40)

## 2023-09-06 LAB — HEMOGLOBIN A1C
Est. average glucose Bld gHb Est-mCnc: 203 mg/dL
Hgb A1c MFr Bld: 8.7 % — ABNORMAL HIGH (ref 4.8–5.6)

## 2023-09-06 LAB — PSA: Prostate Specific Ag, Serum: 0.9 ng/mL (ref 0.0–4.0)

## 2023-09-06 LAB — TSH: TSH: 3 u[IU]/mL (ref 0.450–4.500)

## 2023-09-11 ENCOUNTER — Ambulatory Visit: Payer: BC Managed Care – PPO

## 2023-09-11 ENCOUNTER — Other Ambulatory Visit: Payer: BC Managed Care – PPO

## 2023-09-11 VITALS — BP 110/70 | HR 83 | Temp 97.9°F | Ht 69.0 in | Wt 152.0 lb

## 2023-09-11 DIAGNOSIS — Z23 Encounter for immunization: Secondary | ICD-10-CM

## 2023-09-11 DIAGNOSIS — N1831 Chronic kidney disease, stage 3a: Secondary | ICD-10-CM | POA: Diagnosis not present

## 2023-09-11 NOTE — Progress Notes (Signed)
Patient presents today for his shingles vaccine. YL,RMA

## 2023-09-17 LAB — PROTEIN ELECTROPHORESIS, SERUM
A/G Ratio: 1.2 (ref 0.7–1.7)
Albumin ELP: 3.8 g/dL (ref 2.9–4.4)
Alpha 1: 0.1 g/dL (ref 0.0–0.4)
Alpha 2: 0.6 g/dL (ref 0.4–1.0)
Beta: 0.9 g/dL (ref 0.7–1.3)
Gamma Globulin: 1.5 g/dL (ref 0.4–1.8)
Globulin, Total: 3.2 g/dL (ref 2.2–3.9)
Total Protein: 7 g/dL (ref 6.0–8.5)

## 2023-09-17 LAB — PHOSPHORUS: Phosphorus: 3.5 mg/dL (ref 2.8–4.1)

## 2023-09-17 LAB — PTH, INTACT AND CALCIUM
Calcium: 10.1 mg/dL (ref 8.6–10.2)
PTH: 41 pg/mL (ref 15–65)

## 2023-12-06 ENCOUNTER — Ambulatory Visit: Payer: PRIVATE HEALTH INSURANCE | Admitting: Internal Medicine

## 2024-01-09 ENCOUNTER — Ambulatory Visit: Payer: PRIVATE HEALTH INSURANCE | Admitting: Internal Medicine

## 2024-01-24 ENCOUNTER — Telehealth: Payer: Self-pay

## 2024-01-24 NOTE — Telephone Encounter (Signed)
 Patient was identified as falling into the [place] Measure - Diabetes.   Patient was: Left voicemail to schedule with primary care provider.

## 2024-02-11 ENCOUNTER — Other Ambulatory Visit: Payer: Self-pay | Admitting: Internal Medicine

## 2024-02-11 DIAGNOSIS — E1165 Type 2 diabetes mellitus with hyperglycemia: Secondary | ICD-10-CM

## 2024-06-24 ENCOUNTER — Other Ambulatory Visit: Payer: Self-pay | Admitting: Internal Medicine

## 2024-08-17 ENCOUNTER — Other Ambulatory Visit: Payer: Self-pay | Admitting: Internal Medicine

## 2024-08-17 DIAGNOSIS — E1165 Type 2 diabetes mellitus with hyperglycemia: Secondary | ICD-10-CM

## 2024-09-09 ENCOUNTER — Other Ambulatory Visit: Payer: Self-pay

## 2024-09-09 ENCOUNTER — Ambulatory Visit (INDEPENDENT_AMBULATORY_CARE_PROVIDER_SITE_OTHER): Payer: PRIVATE HEALTH INSURANCE | Admitting: Internal Medicine

## 2024-09-09 ENCOUNTER — Ambulatory Visit: Payer: Self-pay | Admitting: Internal Medicine

## 2024-09-09 ENCOUNTER — Encounter: Payer: Self-pay | Admitting: Internal Medicine

## 2024-09-09 VITALS — BP 110/78 | Temp 98.3°F | Ht 69.0 in | Wt 158.2 lb

## 2024-09-09 DIAGNOSIS — E1165 Type 2 diabetes mellitus with hyperglycemia: Secondary | ICD-10-CM

## 2024-09-09 DIAGNOSIS — Z0000 Encounter for general adult medical examination without abnormal findings: Secondary | ICD-10-CM | POA: Diagnosis not present

## 2024-09-09 DIAGNOSIS — E1122 Type 2 diabetes mellitus with diabetic chronic kidney disease: Secondary | ICD-10-CM

## 2024-09-09 DIAGNOSIS — N1831 Chronic kidney disease, stage 3a: Secondary | ICD-10-CM

## 2024-09-09 DIAGNOSIS — E875 Hyperkalemia: Secondary | ICD-10-CM

## 2024-09-09 LAB — POCT URINALYSIS DIPSTICK
Bilirubin, UA: NEGATIVE
Glucose, UA: POSITIVE — AB
Ketones, UA: NEGATIVE
Leukocytes, UA: NEGATIVE
Nitrite, UA: NEGATIVE
Protein, UA: NEGATIVE
Spec Grav, UA: 1.025 (ref 1.010–1.025)
Urobilinogen, UA: 0.2 U/dL
pH, UA: 5.5 (ref 5.0–8.0)

## 2024-09-09 MED ORDER — FREESTYLE LIBRE 14 DAY SENSOR MISC: 8 refills | Status: AC

## 2024-09-09 NOTE — Progress Notes (Signed)
 I,Allen Arnold, CMA,acting as a neurosurgeon for Allen LOISE Slocumb, MD.,have documented all relevant documentation on the behalf of Allen LOISE Slocumb, MD,as directed by  Allen LOISE Slocumb, MD while in the presence of Allen LOISE Slocumb, MD.  Subjective:  Patient ID: Allen Arnold, Dr. , male    DOB: 1961/03/28 , 63 y.o.   MRN: [id number]  Chief Complaint  Patient presents with   Diabetes    Patient presents today for dm follow up. She reports compliance with medications. Denies headache, chest pain & sob. He will call to make dm eye exam appt.       He presents today for full physical exam. However, he arrived more than 30 minutes late for his appt. No new concerns. No new meds.   Diabetes He presents for his follow-up diabetic visit. He has type 2 diabetes mellitus. There are no hypoglycemic associated symptoms. Pertinent negatives for diabetes include no blurred vision and no chest pain. There are no hypoglycemic complications. Risk factors for coronary artery disease include diabetes mellitus, dyslipidemia and male sex. He is following a diabetic diet. He participates in exercise three times a week. An ACE inhibitor/angiotensin II receptor blocker is being taken. Eye exam is current.     Past Medical History:  Diagnosis Date   Diabetes mellitus without complication (HCC)    Hyperlipidemia    on crestor    Inguinal hernia    left     Family History  Problem Relation Age of Onset   Diabetes Father    Heart disease Mother      Current Outpatient Medications:    aspirin 81 MG chewable tablet, Chew by mouth daily., Disp: , Rfl:    FARXIGA  10 MG TABS tablet, TAKE 1 TABLET BY MOUTH EVERY DAY BEFORE BREAKFAST, Disp: 30 tablet, Rfl: 5   ramipril  (ALTACE ) 2.5 MG capsule, TAKE 1 CAPSULE BY MOUTH EVERY DAY IN THE MORNING, Disp: 90 capsule, Rfl: 2   rosuvastatin  (CRESTOR ) 10 MG tablet, TAKE 1 TABLET BY MOUTH EVERY DAY, Disp: 90 tablet, Rfl: 2   Saxagliptin-Metformin 2.02-999 MG TB24, TAKE 1  TABLET BY MOUTH TWICE A DAY, Disp: 60 tablet, Rfl: 20   Continuous Glucose Sensor (FREESTYLE LIBRE 14 DAY SENSOR) MISC, USE AS DIRECTED TO CHECK BLOOD SUGARS 4 TIMES PER DAY. DX: E11.65, Disp: 2 each, Rfl: 8   insulin  degludec (TRESIBA  FLEXTOUCH) 100 UNIT/ML FlexTouch Pen, INJECT 20 UNITS TOTAL INTO THE SKIN DAILY. TITRATE TO MAX DOSE 60 UNITS NIGHTLY (Patient not taking: Reported on 09/09/2024), Disp: 3 mL, Rfl: 3   Insulin  Pen Needle (BD PEN NEEDLE NANO 2ND GEN) 32G X 4 MM MISC, Use as directed (Patient not taking: Reported on 09/09/2024), Disp: 100 each, Rfl: 11   No Known Allergies   Review of Systems  Constitutional: Negative.   HENT: Negative.    Eyes: Negative.  Negative for blurred vision.  Respiratory: Negative.    Cardiovascular: Negative.  Negative for chest pain.  Gastrointestinal: Negative.   Endocrine: Negative.   Genitourinary: Negative.   Musculoskeletal: Negative.   Skin: Negative.   Allergic/Immunologic: Negative.   Neurological: Negative.   Hematological: Negative.   Psychiatric/Behavioral: Negative.       Today's Vitals   09/09/24 0939  BP: 110/78  Temp: 98.3 F (36.8 C)  SpO2: 98%  Weight: 158 lb 3.2 oz (71.8 kg)  Height: 5' 9 (1.753 m)   Body mass index is 23.36 kg/m.  Wt Readings from Last 3 Encounters:  09/09/24 158 lb  3.2 oz (71.8 kg)  09/11/23 152 lb (68.9 kg)  09/05/23 152 lb 12.8 oz (69.3 kg)    The 10-year ASCVD risk score (Arnett DK, et al., 2019) is: 10.7%   Values used to calculate the score:     Age: 69 years     Clincally relevant sex: Male     Is Non-Hispanic African American: Yes     Diabetic: Yes     Tobacco smoker: No     Systolic Blood Pressure: 110 mmHg     Is BP treated: No     HDL Cholesterol: 73 mg/dL     Total Cholesterol: 188 mg/dL  Objective:  Physical Exam Vitals and nursing note reviewed.  Constitutional:      Appearance: Normal appearance.  HENT:     Head: Normocephalic and atraumatic.     Right Ear: Tympanic  membrane, ear canal and external ear normal.     Left Ear: Tympanic membrane, ear canal and external ear normal.     Nose: Nose normal.     Mouth/Throat:     Mouth: Mucous membranes are moist.     Pharynx: Oropharynx is clear.  Eyes:     Extraocular Movements: Extraocular movements intact.     Conjunctiva/sclera: Conjunctivae normal.     Pupils: Pupils are equal, round, and reactive to light.  Cardiovascular:     Rate and Rhythm: Normal rate and regular rhythm.     Pulses: Normal pulses.          Dorsalis pedis pulses are 2+ on the right side and 2+ on the left side.     Heart sounds: Normal heart sounds.  Pulmonary:     Effort: Pulmonary effort is normal.     Breath sounds: Normal breath sounds.  Chest:  Breasts:    Right: Normal.     Left: Normal.  Abdominal:     General: Abdomen is flat. Bowel sounds are normal.     Palpations: Abdomen is soft.  Genitourinary:    Comments: Deferred  Musculoskeletal:        General: Normal range of motion.     Cervical back: Normal range of motion and neck supple.  Feet:     Right foot:     Protective Sensation: 5 sites tested.  5 sites sensed.     Skin integrity: Skin integrity normal.     Toenail Condition: Right toenails are normal.     Left foot:     Protective Sensation: 5 sites tested.  5 sites sensed.     Skin integrity: Skin integrity normal.     Toenail Condition: Left toenails are normal.  Skin:    General: Skin is warm.  Neurological:     General: No focal deficit present.     Mental Status: He is alert.  Psychiatric:        Mood and Affect: Mood normal.        Behavior: Behavior normal.      Assessment And Plan:   Assessment & Plan Encounter for annual health examination A full exam was performed.  DRE deferred.  He is advised to get 30-45 minutes of regular exercise, no less than four to five days per week. Both weight-bearing and aerobic exercises are recommended.  He is advised to follow a healthy diet with at  least six fruits/veggies per day, decrease intake of red meat and other saturated fats and to increase fish intake to twice weekly.  Meats/fish should not be fried --  baked, boiled or broiled is preferable. It is also important to cut back on your sugar intake.  Be sure to read labels - try to avoid anything with added sugar, high fructose corn syrup or other sweeteners.  If you must use a sweetener, you can try stevia or monkfruit.  It is also important to avoid artificially sweetened foods/beverages and diet drinks. Lastly, wear SPF 50 sunscreen on exposed skin and when in direct sunlight for an extended period of time.  Be sure to avoid fast food restaurants and aim for at least 60 ounces of water daily.     Uncontrolled type 2 diabetes mellitus with hyperglycemia (HCC) Chronic, diabetic foot exam was performed.  He will continue with Farxiga  10mg  daily along with Kombiglyze  2.5/1000mg  twice daily. He will rto in 3 months for re-evaluation. I DISCUSSED WITH THE PATIENT AT LENGTH REGARDING THE GOALS OF GLYCEMIC CONTROL AND POSSIBLE LONG-TERM COMPLICATIONS.  I  ALSO STRESSED THE IMPORTANCE OF COMPLIANCE WITH HOME GLUCOSE MONITORING, DIETARY RESTRICTIONS INCLUDING AVOIDANCE OF SUGARY DRINKS/PROCESSED FOODS,  ALONG WITH REGULAR EXERCISE.  I  ALSO STRESSED THE IMPORTANCE OF ANNUAL EYE EXAMS, SELF FOOT CARE AND COMPLIANCE WITH OFFICE VISITS.  Type 2 diabetes mellitus with stage 3a chronic kidney disease, without long-term current use of insulin  (HCC) Chronic, encouraged to follow up with Nephrology.  Stage 3a chronic kidney disease (HCC) Chronic, he is encouraged to stay well hydrated, avoid NSAIDs and keep BP controlled to prevent progression of CKD.    Orders Placed This Encounter  Procedures   CMP14+EGFR   Lipid panel   Hemoglobin A1c   CBC   Urine Albumin/Creatinine with ratio (send out) [[id number]]   PSA   PTH, intact and calcium    Phosphorus   Protein electrophoresis, serum   POCT Urinalysis  Dipstick (18997)     Return for 1 year physical, 4 month DM.  Patient was given opportunity to ask questions. Patient verbalized understanding of the plan and was able to repeat key elements of the plan. All questions were answered to their satisfaction.    I, Allen LOISE Slocumb, MD, have reviewed all documentation for this visit. The documentation on 09/09/24 for the exam, diagnosis, procedures, and orders are all accurate and complete.   IF YOU HAVE BEEN REFERRED TO A SPECIALIST, IT MAY TAKE 1-2 WEEKS TO SCHEDULE/PROCESS THE REFERRAL. IF YOU HAVE NOT HEARD FROM US /SPECIALIST IN TWO WEEKS, PLEASE GIVE US  A CALL AT [phone].

## 2024-09-09 NOTE — Patient Instructions (Addendum)

## 2024-09-12 LAB — PROTEIN ELECTROPHORESIS, SERUM
A/G Ratio: 1.1 (ref 0.7–1.7)
Albumin ELP: 3.9 g/dL (ref 2.9–4.4)
Alpha 1: 0.2 g/dL (ref 0.0–0.4)
Alpha 2: 0.7 g/dL (ref 0.4–1.0)
Beta: 1.1 g/dL (ref 0.7–1.3)
Gamma Globulin: 1.8 g/dL (ref 0.4–1.8)
Globulin, Total: 3.7 g/dL (ref 2.2–3.9)

## 2024-09-12 LAB — LIPID PANEL
Chol/HDL Ratio: 2.6 ratio (ref 0.0–5.0)
Cholesterol, Total: 188 mg/dL (ref 100–199)
HDL: 73 mg/dL (ref >39)
LDL Chol Calc (NIH): 105 mg/dL — ABNORMAL HIGH (ref 0–99)
Triglycerides: 49 mg/dL (ref 0–149)
VLDL Cholesterol Cal: 10 mg/dL (ref 5–40)

## 2024-09-12 LAB — CMP14+EGFR
ALT: 24 IU/L (ref 0–44)
AST: 17 IU/L (ref 0–40)
Albumin: 4.3 g/dL (ref 3.9–4.9)
Alkaline Phosphatase: 73 IU/L (ref 47–123)
BUN/Creatinine Ratio: 12 (ref 10–24)
BUN: 20 mg/dL (ref 8–27)
Bilirubin Total: 0.5 mg/dL (ref 0.0–1.2)
CO2: 23 mmol/L (ref 20–29)
Calcium: 10.4 mg/dL — ABNORMAL HIGH (ref 8.6–10.2)
Chloride: 105 mmol/L (ref 96–106)
Creatinine, Ser: 1.61 mg/dL — ABNORMAL HIGH (ref 0.76–1.27)
Globulin, Total: 3.3 g/dL (ref 1.5–4.5)
Glucose: 156 mg/dL — ABNORMAL HIGH (ref 70–99)
Potassium: 5.8 mmol/L — ABNORMAL HIGH (ref 3.5–5.2)
Sodium: 140 mmol/L (ref 134–144)
Total Protein: 7.6 g/dL (ref 6.0–8.5)
eGFR: 48 mL/min/1.73 — ABNORMAL LOW (ref >59)

## 2024-09-12 LAB — CBC
Hematocrit: 44.8 % (ref 37.5–51.0)
Hemoglobin: 14.4 g/dL (ref 13.0–17.7)
MCH: 31.4 pg (ref 26.6–33.0)
MCHC: 32.1 g/dL (ref 31.5–35.7)
MCV: 98 fL — ABNORMAL HIGH (ref 79–97)
Platelets: 229 x10E3/uL (ref 150–450)
RBC: 4.59 x10E6/uL (ref 4.14–5.80)
RDW: 13.3 % (ref 11.6–15.4)
WBC: 2.9 x10E3/uL — ABNORMAL LOW (ref 3.4–10.8)

## 2024-09-12 LAB — MICROALBUMIN / CREATININE URINE RATIO
Creatinine, Urine: 117.2 mg/dL
Microalb/Creat Ratio: 10 mg/g{creat} (ref 0–29)
Microalbumin, Urine: 11.9 ug/mL

## 2024-09-12 LAB — PTH, INTACT AND CALCIUM: PTH: 60 pg/mL (ref 15–65)

## 2024-09-12 LAB — PHOSPHORUS: Phosphorus: 3.4 mg/dL (ref 2.8–4.1)

## 2024-09-12 LAB — HEMOGLOBIN A1C
Est. average glucose Bld gHb Est-mCnc: 189 mg/dL
Hgb A1c MFr Bld: 8.2 % — ABNORMAL HIGH (ref 4.8–5.6)

## 2024-09-12 LAB — PSA: Prostate Specific Ag, Serum: 0.9 ng/mL (ref 0.0–4.0)

## 2024-09-13 ENCOUNTER — Encounter: Payer: Self-pay | Admitting: Internal Medicine

## 2024-09-13 DIAGNOSIS — N1831 Chronic kidney disease, stage 3a: Secondary | ICD-10-CM | POA: Insufficient documentation

## 2024-09-13 NOTE — Assessment & Plan Note (Signed)
 Chronic, diabetic foot exam was performed. He will continue with [place] 10mg  daily along with Kombiglyze 2.5/1000mg  twice daily. He will rto in 3 months for re-evaluation. I DISCUSSED WITH THE PATIENT AT LENGTH REGARDING THE GOALS OF GLYCEMIC CONTROL AND POSSIBLE LONG-TERM COMPLICATIONS.  I  ALSO STRESSED THE IMPORTANCE OF COMPLIANCE WITH HOME GLUCOSE MONITORING, DIETARY RESTRICTIONS INCLUDING AVOIDANCE OF SUGARY DRINKS/PROCESSED FOODS,  ALONG WITH REGULAR EXERCISE.  I  ALSO STRESSED THE IMPORTANCE OF ANNUAL EYE EXAMS, SELF FOOT CARE AND COMPLIANCE WITH OFFICE VISITS.

## 2024-09-13 NOTE — Assessment & Plan Note (Signed)
 Chronic, he is encouraged to stay well hydrated, avoid NSAIDs and keep BP controlled to prevent progression of CKD.

## 2024-09-13 NOTE — Assessment & Plan Note (Signed)
 Chronic, encouraged to follow up with Nephrology.

## 2024-09-13 NOTE — Assessment & Plan Note (Signed)
A full exam was performed.  DRE deferred.  He is advised to get 30-45 minutes of regular exercise, no less than four to five days per week. Both weight-bearing and aerobic exercises are recommended.  He is advised to follow a healthy diet with at least six fruits/veggies per day, decrease intake of red meat and other saturated fats and to increase fish intake to twice weekly.  Meats/fish should not be fried -- baked, boiled or broiled is preferable. It is also important to cut back on your sugar intake.  Be sure to read labels - try to avoid anything with added sugar, high fructose corn syrup or other sweeteners.  If you must use a sweetener, you can try stevia or monkfruit.  It is also important to avoid artificially sweetened foods/beverages and diet drinks. Lastly, wear SPF 50 sunscreen on exposed skin and when in direct sunlight for an extended period of time.  Be sure to avoid fast food restaurants and aim for at least 60 ounces of water daily.

## 2025-01-22 ENCOUNTER — Ambulatory Visit: Admitting: Internal Medicine

## 2025-09-22 ENCOUNTER — Encounter: Payer: Self-pay | Admitting: Internal Medicine
